# Patient Record
Sex: Male | Born: 1955 | Race: White | Hispanic: No | Marital: Married | Smoking: Former smoker
Health system: Southern US, Community
[De-identification: ages and names within clinical notes are randomized; demographics above are authoritative.]

## PROBLEM LIST (undated history)

## (undated) DIAGNOSIS — G4730 Sleep apnea, unspecified: Secondary | ICD-10-CM

## (undated) DIAGNOSIS — H9313 Tinnitus, bilateral: Secondary | ICD-10-CM

## (undated) DIAGNOSIS — M7500 Adhesive capsulitis of unspecified shoulder: Secondary | ICD-10-CM

## (undated) DIAGNOSIS — K649 Unspecified hemorrhoids: Secondary | ICD-10-CM

## (undated) DIAGNOSIS — M722 Plantar fascial fibromatosis: Secondary | ICD-10-CM

## (undated) DIAGNOSIS — L989 Disorder of the skin and subcutaneous tissue, unspecified: Secondary | ICD-10-CM

## (undated) DIAGNOSIS — I739 Peripheral vascular disease, unspecified: Secondary | ICD-10-CM

## (undated) DIAGNOSIS — E785 Hyperlipidemia, unspecified: Secondary | ICD-10-CM

## (undated) DIAGNOSIS — Z972 Presence of dental prosthetic device (complete) (partial): Secondary | ICD-10-CM

## (undated) DIAGNOSIS — M1990 Unspecified osteoarthritis, unspecified site: Secondary | ICD-10-CM

## (undated) DIAGNOSIS — C801 Malignant (primary) neoplasm, unspecified: Secondary | ICD-10-CM

## (undated) DIAGNOSIS — I10 Essential (primary) hypertension: Secondary | ICD-10-CM

## (undated) DIAGNOSIS — H9190 Unspecified hearing loss, unspecified ear: Secondary | ICD-10-CM

## (undated) DIAGNOSIS — T148XXA Other injury of unspecified body region, initial encounter: Secondary | ICD-10-CM

## (undated) DIAGNOSIS — R7989 Other specified abnormal findings of blood chemistry: Secondary | ICD-10-CM

## (undated) DIAGNOSIS — H43812 Vitreous degeneration, left eye: Secondary | ICD-10-CM

## (undated) HISTORY — DX: Other injury of unspecified body region, initial encounter: T14.8XXA

## (undated) HISTORY — DX: Other specified abnormal findings of blood chemistry: R79.89

## (undated) HISTORY — PX: TONSILLECTOMY: SUR1361

## (undated) HISTORY — DX: Vitreous degeneration, left eye: H43.812

## (undated) HISTORY — DX: Disorder of the skin and subcutaneous tissue, unspecified: L98.9

## (undated) HISTORY — DX: Sleep apnea, unspecified: G47.30

## (undated) HISTORY — PX: SKIN BIOPSY: SHX1

## (undated) HISTORY — PX: PROSTATE SURGERY: SHX751

## (undated) HISTORY — PX: APPENDECTOMY: SHX54

---

## 1996-01-31 HISTORY — PX: HAMMER TOE SURGERY: SHX385

## 2005-09-26 ENCOUNTER — Ambulatory Visit: Payer: Self-pay | Admitting: Gastroenterology

## 2007-10-25 ENCOUNTER — Ambulatory Visit: Payer: Self-pay | Admitting: Urology

## 2008-01-30 ENCOUNTER — Ambulatory Visit: Payer: Self-pay | Admitting: Urology

## 2008-12-21 ENCOUNTER — Ambulatory Visit: Payer: Self-pay | Admitting: Gastroenterology

## 2010-01-30 HISTORY — PX: OTHER SURGICAL HISTORY: SHX169

## 2011-07-28 ENCOUNTER — Encounter: Payer: Self-pay | Admitting: Urology

## 2011-07-28 LAB — HEMATOCRIT: HCT: 55 % — ABNORMAL HIGH (ref 40.0–52.0)

## 2011-07-31 ENCOUNTER — Encounter: Payer: Self-pay | Admitting: Urology

## 2011-08-02 LAB — HEMOGLOBIN: HGB: 16.6 g/dL (ref 13.0–18.0)

## 2011-08-02 LAB — HEMATOCRIT: HCT: 49.5 % (ref 40.0–52.0)

## 2011-08-31 ENCOUNTER — Encounter: Payer: Self-pay | Admitting: Urology

## 2011-11-03 ENCOUNTER — Ambulatory Visit: Payer: Self-pay

## 2012-01-31 HISTORY — PX: LASER OF PROSTATE W/ GREEN LIGHT PVP: SHX1953

## 2012-12-31 ENCOUNTER — Ambulatory Visit: Payer: Self-pay | Admitting: Urology

## 2013-01-06 ENCOUNTER — Ambulatory Visit: Payer: Self-pay | Admitting: Urology

## 2014-03-02 HISTORY — PX: LYMPH NODE BIOPSY: SHX201

## 2014-03-31 DIAGNOSIS — C801 Malignant (primary) neoplasm, unspecified: Secondary | ICD-10-CM

## 2014-03-31 HISTORY — DX: Malignant (primary) neoplasm, unspecified: C80.1

## 2014-04-01 ENCOUNTER — Ambulatory Visit: Payer: Self-pay | Admitting: Otolaryngology

## 2014-04-07 ENCOUNTER — Ambulatory Visit
Admit: 2014-04-07 | Disposition: A | Discharge status: Home / Self Care | Payer: Self-pay | Attending: Oncology | Admitting: Oncology

## 2014-04-13 ENCOUNTER — Ambulatory Visit: Payer: Self-pay | Admitting: Otolaryngology

## 2014-04-14 ENCOUNTER — Ambulatory Visit: Payer: Self-pay | Admitting: Oncology

## 2014-05-01 ENCOUNTER — Ambulatory Visit
Admit: 2014-05-01 | Disposition: A | Discharge status: Home / Self Care | Payer: Self-pay | Attending: Oncology | Admitting: Oncology

## 2014-05-15 ENCOUNTER — Other Ambulatory Visit: Payer: Self-pay | Admitting: Oncology

## 2014-05-15 DIAGNOSIS — C8298 Follicular lymphoma, unspecified, lymph nodes of multiple sites: Secondary | ICD-10-CM

## 2014-05-15 DIAGNOSIS — C8590 Non-Hodgkin lymphoma, unspecified, unspecified site: Secondary | ICD-10-CM

## 2014-05-22 NOTE — Op Note (Signed)
PATIENT NAME:  Jared Cowan, Jared Cowan MR#:  [id number] DATE OF BIRTH:  Oct 22, 1955  DATE OF PROCEDURE:  01/06/2013  PREOPERATIVE DIAGNOSIS: Benign prostatic hypertrophy with bladder outlet obstruction.   POSTOPERATIVE DIAGNOSIS: Benign prostatic hypertrophy with bladder outlet obstruction.   PROCEDURE: Photovaporization of the prostate with green light laser.   SURGEON: Maryan Puls, M.D.   ANESTHETIST: Dennard Nip, M.D.   ANESTHESIA: General.   INDICATIONS: See the dictated history and physical. After informed consent, the patient requests the above procedure.   OPERATIVE SUMMARY: After adequate general anesthesia had been obtained, the patient was placed into dorsal lithotomy position and the perineum was prepped and draped in the usual fashion. The laser scope was coupled with the camera and then visually advanced into the bladder. The bladder was thoroughly inspected. Both ureteral orifices were identified and had clear efflux. No bladder tumors were identified. The patient had trilobar prostatic hypertrophy. At this point, the green light laser power was set at 80 watts and bladder neck tissue was vaporized. Power was then increased up to 120 watts and obstructive tissue from the bladder neck to the verumontanum was vaporized. Finally, the power was increased to 180 watts and remaining obstructive tissue vaporized. Laser scope was then removed. Catheter was placed. The catheter had significant bloody drainage. Therefore, the catheter was removed and resectoscope was placed. Using the roller ball electrode, the prostatic fossa was coagulated. The resectoscope was then removed and a 20-French Foley catheter placed. The catheter was irrigated until clear. B and O suppository was placed. The procedure was then terminated and the patient was transferred to the recovery room in stable condition.   ____________________________ Otelia Limes. Yves Dill, MD [email or web address] D: 01/06/2013 09:30:03 ET T: 01/06/2013  09:50:31 ET JOB#: [id number]  cc: Otelia Limes. Yves Dill, MD, <Dictator> Royston Cowper MD ELECTRONICALLY SIGNED 01/07/2013 8:18

## 2014-05-22 NOTE — H&P (Signed)
PATIENT NAME:  Jared Cowan, Jared Cowan MR#:  [id number] DATE OF BIRTH:  06-18-1955  DATE OF ADMISSION:  01/06/2013  CHIEF COMPLAINT: Difficulty voiding.   HISTORY OF PRESENT ILLNESS: Jared Cowan is a 59 year old white male with a long history of BPH and lower urinary tract symptoms. 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 He has not had significant improvement of his urinary symptoms. At this time AUA score was 21. He is on Jalyn and has been on that for more than a year without significant symptomatic improvement. He has also tried daily Cialis without symptomatic improvement. He underwent ultrasound of the prostate on 09/08 indicating a 58.9 gram prostate with a median lobe present. He comes in now for photo vaporization of the prostate with the green light laser.   ALLERGIES:  No drug allergies.   CURRENT MEDICATIONS: Include Cialis, Jalyn, benazepril, Singulair, fish oil, and Tylenol.   PAST SURGICAL HISTORY:  No previous surgical procedures.   SOCIAL HISTORY: The patient denied tobacco or alcohol use.   FAMILY HISTORY: Negative for prostate cancer.   PAST AND CURRENT MEDICAL CONDITIONS:  1.  Sleep apnea.  2.  Allergic rhinitis.  3.  Hyperlipidemia.  4.  Hypertension.   REVIEW OF SYSTEMS: The patient denied chest pain, shortness of breath, stroke, heart disease or coronary artery disease.   PHYSICAL EXAMINATION: GENERAL: A well-nourished white male in no distress.  HEENT: Sclerae are clear. Pupils were equally round, reactive to light and accommodation.  NECK: Supple. No palpable cervical adenopathy.  LUNGS: Clear to auscultation.  CARDIOVASCULAR: Regular rhythm and rate without audible murmurs.  ABDOMEN: Soft, nontender abdomen.  GENITOURINARY: Circumcised. Testes smooth and nontender.  RECTAL: 50 grams smooth nontender prostate.  NEUROMUSCULAR: Alert and oriented x 3.   IMPRESSION: 1.  Benign prostatic hypertrophy with bladder outlet obstruction.  2.  Erectile dysfunction. 3.   Hypogonadism.    PLAN:  Photo vaporization prostate with the green light laser.     ____________________________ Otelia Limes. Yves Dill, MD [email or web address] D: 12/31/2012 16:12:00 ET T: 12/31/2012 16:42:45 ET JOB#: [id number]  cc: Otelia Limes. Yves Dill, MD, <Dictator> Royston Cowper MD ELECTRONICALLY SIGNED 12/31/2012 16:59

## 2014-05-25 LAB — SURGICAL PATHOLOGY

## 2014-05-31 NOTE — Op Note (Signed)
PATIENT NAME:  Jared Cowan, Jared Cowan MR#:  [id number]  DATE OF PROCEDURE:   04/13/2014  PREOPERATIVE DIAGNOSIS: Left deep neck node with suspicion of lymphoma.   POSTOPERATIVE DIAGNOSIS:  Left deep neck node with suspicion of lymphoma.  OPERATIVE PROCEDURE:  Excision left deep neck node.   SURGEON: Huey Romans, M.D.   ANESTHESIA: General.   COMPLICATIONS: None.   TOTAL ESTIMATED BLOOD LOSS:  25 mL.   DESCRIPTION OF PROCEDURE: The patient was given general anesthesia by laryngeal mask anesthesia. He was not paralyzed so that we could monitor his nerves in his neck. A shoulder roll was placed, the head was extended some, turned to the right.  You could feel a large left deep neck node that was posterior to the sternocleidomastoid and lie in the mid neck near the superior supraclavicular tissues. This was approximately 4 cm in diameter. The area was marked first and 2.5 mL of 1% Xylocaine with epinephrine 100,000 was used for infiltration of the skin. He was then prepped and draped in sterile fashion.   An incision was created through the skin and subcutaneous down to the deep tissues and fascia was incised and then large node was visible. There was a fairly significant vein that was crossing directly over the node. This was cut with Harmonic scalpel posteriorly, but anteriorly it was a little bit larger and a 2-0 silk tie was placed on the end of it. There is lots of fibrous tissue attached to the node all the way around.  It was followed deep.  It was attached to muscle bed inferiorly and the Harmonic scalpel was used to free this up. A nerve stimulator was used to look for the 11th cranial nerve, but this was not found in the wound. The dissection was carried around the large lymph node. There were multiple small little lymph nodes that were attached to it that were evident in the neck as well.  Many of these came out along with the large mass. Dissection was carried slowly around it freeing up the  fibrous tissue and small vessels that were attached to it and removing some of these lymph nodes. Finally got all the way around it to free it up and remove it from the wound.  It was sent fresh to pathology with suspicion of lymphoma. The wound was copiously irrigated.  We used bipolar cautery to help control any small bits of oozing. It was relatively dry and felt that a drain was not necessary. Four-0 Vicryl was used deep to help close some of the dead space and then 4-0 Vicryls were used in the muscle layer to help enclose the dermis. The skin edges were then held in apposition using a 6-0 nylon running locking suture. The wound was covered with bacitracin, Telfa and Tegaderm.  The patient tolerated the procedure well. He was awakened and taken to the recovery room in satisfactory condition. There were no operative complications. Total estimated blood loss was 25 mL.    ____________________________ Huey Romans, MD phj:sp D: 04/13/2014 09:54:00 ET T: 04/13/2014 10:25:25 ET JOB#: [id number]  cc: Huey Romans, MD, <Dictator> Huey Romans MD ELECTRONICALLY SIGNED 04/20/2014 10:44

## 2014-07-13 ENCOUNTER — Other Ambulatory Visit: Payer: Self-pay | Admitting: Oncology

## 2014-07-13 DIAGNOSIS — C8298 Follicular lymphoma, unspecified, lymph nodes of multiple sites: Secondary | ICD-10-CM

## 2014-07-13 DIAGNOSIS — C8590 Non-Hodgkin lymphoma, unspecified, unspecified site: Secondary | ICD-10-CM

## 2014-07-14 ENCOUNTER — Ambulatory Visit
Admission: RE | Admit: 2014-07-14 | Discharge: 2014-07-14 | Disposition: A | Discharge status: Home / Self Care | Payer: 59 | Source: Ambulatory Visit | Attending: Oncology | Admitting: Oncology

## 2014-07-14 ENCOUNTER — Other Ambulatory Visit: Payer: Self-pay

## 2014-07-14 ENCOUNTER — Other Ambulatory Visit: Payer: Self-pay | Admitting: Oncology

## 2014-07-14 ENCOUNTER — Other Ambulatory Visit: Payer: Self-pay | Admitting: *Deleted

## 2014-07-14 ENCOUNTER — Inpatient Hospital Stay: Payer: 59 | Attending: Oncology

## 2014-07-14 DIAGNOSIS — C8290 Follicular lymphoma, unspecified, unspecified site: Secondary | ICD-10-CM | POA: Insufficient documentation

## 2014-07-14 DIAGNOSIS — Z79899 Other long term (current) drug therapy: Secondary | ICD-10-CM | POA: Diagnosis not present

## 2014-07-14 DIAGNOSIS — I10 Essential (primary) hypertension: Secondary | ICD-10-CM | POA: Diagnosis not present

## 2014-07-14 DIAGNOSIS — C8300 Small cell B-cell lymphoma, unspecified site: Secondary | ICD-10-CM | POA: Insufficient documentation

## 2014-07-14 DIAGNOSIS — C8580 Other specified types of non-Hodgkin lymphoma, unspecified site: Secondary | ICD-10-CM | POA: Insufficient documentation

## 2014-07-14 DIAGNOSIS — R079 Chest pain, unspecified: Secondary | ICD-10-CM | POA: Diagnosis not present

## 2014-07-14 HISTORY — DX: Essential (primary) hypertension: I10

## 2014-07-14 HISTORY — DX: Malignant (primary) neoplasm, unspecified: C80.1

## 2014-07-14 LAB — BASIC METABOLIC PANEL
ANION GAP: 3 — AB (ref 5–15)
BUN: 16 mg/dL (ref 6–20)
CO2: 32 mmol/L (ref 22–32)
Calcium: 9.3 mg/dL (ref 8.9–10.3)
Chloride: 103 mmol/L (ref 101–111)
Creatinine, Ser: 1.14 mg/dL (ref 0.61–1.24)
GFR calc Af Amer: >60 mL/min (ref >60)
GFR calc non Af Amer: >60 mL/min (ref >60)
Glucose, Bld: 110 mg/dL — ABNORMAL HIGH (ref 65–99)
POTASSIUM: 4.3 mmol/L (ref 3.5–5.1)
SODIUM: 138 mmol/L (ref 135–145)

## 2014-07-14 LAB — CBC WITH DIFFERENTIAL/PLATELET
BASOS ABS: 0 10*3/uL (ref 0–0.1)
BASOS PCT: 1 %
EOS PCT: 2 %
Eosinophils Absolute: 0.1 10*3/uL (ref 0–0.7)
HCT: 50.8 % (ref 40.0–52.0)
HEMOGLOBIN: 17.2 g/dL (ref 13.0–18.0)
Lymphocytes Relative: 19 %
Lymphs Abs: 1.1 10*3/uL (ref 1.0–3.6)
MCH: 30.9 pg (ref 26.0–34.0)
MCHC: 33.8 g/dL (ref 32.0–36.0)
MCV: 91.3 fL (ref 80.0–100.0)
Monocytes Absolute: 0.6 10*3/uL (ref 0.2–1.0)
Monocytes Relative: 11 %
NEUTROS PCT: 67 %
Neutro Abs: 3.9 10*3/uL (ref 1.4–6.5)
PLATELETS: 142 10*3/uL — AB (ref 150–440)
RBC: 5.57 MIL/uL (ref 4.40–5.90)
RDW: 13.5 % (ref 11.5–14.5)
WBC: 5.8 10*3/uL (ref 3.8–10.6)

## 2014-07-14 MED ORDER — IOHEXOL 350 MG/ML SOLN
100.0000 mL | Freq: Once | INTRAVENOUS | Status: AC | PRN
  Administered 2014-07-14: 100 mL via INTRAVENOUS

## 2014-07-15 LAB — KAPPA/LAMBDA LIGHT CHAINS
Kappa free light chain: 24.09 mg/L — ABNORMAL HIGH (ref 3.30–19.40)
Kappa, lambda light chain ratio: 1.96 — ABNORMAL HIGH (ref 0.26–1.65)
LAMDA FREE LIGHT CHAINS: 12.29 mg/L (ref 5.71–26.30)

## 2014-07-17 ENCOUNTER — Inpatient Hospital Stay: Payer: 59

## 2014-07-17 ENCOUNTER — Inpatient Hospital Stay (HOSPITAL_BASED_OUTPATIENT_CLINIC_OR_DEPARTMENT_OTHER): Payer: 59 | Admitting: Oncology

## 2014-07-17 VITALS — BP 143/76 | HR 57 | Temp 97.0°F | Resp 20 | Ht 69.0 in | Wt 228.8 lb

## 2014-07-17 DIAGNOSIS — C8300 Small cell B-cell lymphoma, unspecified site: Secondary | ICD-10-CM

## 2014-07-17 DIAGNOSIS — Z79899 Other long term (current) drug therapy: Secondary | ICD-10-CM | POA: Diagnosis not present

## 2014-07-24 ENCOUNTER — Ambulatory Visit (INDEPENDENT_AMBULATORY_CARE_PROVIDER_SITE_OTHER): Payer: 59 | Admitting: Family Medicine

## 2014-07-24 ENCOUNTER — Encounter: Payer: Self-pay | Admitting: Family Medicine

## 2014-07-24 VITALS — BP 143/79 | HR 66 | Temp 98.2°F | Ht 69.3 in | Wt 228.6 lb

## 2014-07-24 DIAGNOSIS — M7502 Adhesive capsulitis of left shoulder: Secondary | ICD-10-CM

## 2014-07-24 DIAGNOSIS — S76311A Strain of muscle, fascia and tendon of the posterior muscle group at thigh level, right thigh, initial encounter: Secondary | ICD-10-CM

## 2014-07-24 DIAGNOSIS — S76319A Strain of muscle, fascia and tendon of the posterior muscle group at thigh level, unspecified thigh, initial encounter: Secondary | ICD-10-CM | POA: Insufficient documentation

## 2014-07-24 DIAGNOSIS — M7500 Adhesive capsulitis of unspecified shoulder: Secondary | ICD-10-CM | POA: Insufficient documentation

## 2014-07-24 NOTE — Progress Notes (Signed)
BP 143/79 mmHg  Pulse 66  Temp(Src) 98.2 F (36.8 C)  Ht 5' 9.3" (1.76 m)  Wt 228 lb 9.6 oz (103.692 kg)  BMI 33.47 kg/m2  SpO2 96%   Subjective:    Patient ID: Jared Cowan, male    DOB: 10-11-1955, 59 y.o.   MRN: [id number]  HPI: Jared Cowan is a 59 y.o. male  Chief Complaint  Patient presents with  . Leg Pain    pulled hamstring  . Shoulder Pain    left shoulder, unable to lift arm up without pain   Has had 2 major concerns- pulled his R hamstring, foot slipped when he was running over [employer] day. Has been doing some rest, ice and elevation. Seems like it is getting a bit better. Has not been taking advil with it. Pain is gone, but mainly now mainly just concerned about the bruising and wants to get back on his bike. No n/t. No swelling.   L shoulder had a biopsy at the cancer center, has been having some decreased ROM of the L shoulder since then. Had not been doing any stretches. He would like to get into PT. His wife is currently doing PT with [facility] and he would like to go there too. No other concerns or complaints at this time.   Relevant past medical, surgical, family and social history reviewed and updated as indicated. Interim medical history since our last visit reviewed. Allergies and medications reviewed and updated.  Review of Systems  Constitutional: Negative.   Respiratory: Negative.   Cardiovascular: Negative.   Musculoskeletal: Positive for myalgias and arthralgias. Negative for back pain, gait problem, neck pain and neck stiffness.  Skin: Positive for color change. Negative for pallor, rash and wound.  Psychiatric/Behavioral: Negative.     Per HPI unless specifically indicated above     Objective:    BP 143/79 mmHg  Pulse 66  Temp(Src) 98.2 F (36.8 C)  Ht 5' 9.3" (1.76 m)  Wt 228 lb 9.6 oz (103.692 kg)  BMI 33.47 kg/m2  SpO2 96%  Wt Readings from Last 3 Encounters:  07/24/14 228 lb 9.6 oz (103.692 kg)  07/23/14 226 lb (102.513 kg)   07/17/14 228 lb 13.4 oz (103.8 kg)    Physical Exam  Constitutional: He is oriented to person, place, and time. He appears well-developed and well-nourished. No distress.  HENT:  Head: Normocephalic and atraumatic.  Right Ear: Hearing normal.  Left Ear: Hearing normal.  Nose: Nose normal.  Eyes: Conjunctivae and lids are normal. Right eye exhibits no discharge. Left eye exhibits no discharge. No scleral icterus.  Pulmonary/Chest: Effort normal. No respiratory distress.  Neurological: He is alert and oriented to person, place, and time.  Skin: Skin is intact. No rash noted.  Bruising on the medial R thigh and lateral R thigh, tender to touch  Psychiatric: He has a normal mood and affect. His speech is normal and behavior is normal. Judgment and thought content normal. Cognition and memory are normal.     Shoulder: left    Inspection:  no swelling, ecchymosis, erythema or step off deformity.    Tenderness to Palpation:    Acromion: no    AC joint:no    Clavicle: no    Bicipital groove: no    Scapular spine: no    Coracoid process: no    Humeral head: no    Supraspinatus tendon: no     Range of Motion:     Abduction:Decreased 120 degrees  Adduction: Decreased    Flexion: Decreased 110 degrees    Extension: Decreased    Internal rotation: Normal    External rotation: Decreased    Painful arc: yes     Muscle Strength: 5/5 bilaterally    Neuro: Sensation WNL. and Upper extremity reflexes WNL.      Assessment & Plan:   Problem List Items Addressed This Visit      Musculoskeletal and Integument   Hamstring strain - Primary    Advised patient to continue icing. Advised him that a 20-80mile bike ride might be too long, and to listen to his body and try cycling for just a few miles and stopping if it's hurting first. He will call if not getting better or getting worse. Continue to monitor.       Frozen shoulder    L shoulder seems to have frozen after his biopsy with lack  of movement. Will refer to PT. Recheck in 4-6 weeks unless getting worse in which case we will see him earlier.      Relevant Orders   Ambulatory referral to Physical Therapy       Follow up plan: Return 4-6 weeks, for Follow up on shoulder.

## 2014-07-24 NOTE — Assessment & Plan Note (Signed)
L shoulder seems to have frozen after his biopsy with lack of movement. Will refer to PT. Recheck in 4-6 weeks unless getting worse in which case we will see him earlier.

## 2014-07-24 NOTE — Assessment & Plan Note (Signed)
Advised patient to continue icing. Advised him that a 20-78mile bike ride might be too long, and to listen to his body and try cycling for just a few miles and stopping if it's hurting first. He will call if not getting better or getting worse. Continue to monitor.

## 2014-07-24 NOTE — Patient Instructions (Signed)
Hamstring Strain with Rehab The hamstring muscle and tendons are vulnerable to muscle or tendon tear (strain). Hamstring tears cause pain and inflammation in the backside of the thigh, where the hamstring muscles are located. The hamstring is comprised of three muscles that are responsible for straightening the hip, bending the knee, and stabilizing the knee. These muscles are important for walking, running, and jumping. Hamstring strain is the most common injury of the thigh. Hamstring strains are classified as grade 1 or 2 strains. Grade 1 strains cause pain, but the tendon is not lengthened. Grade 2 strains include a lengthened ligament due to the ligament being stretched or partially ruptured. With grade 2 strains there is still function, although the function may be decreased.  SYMPTOMS   Pain, tenderness, swelling, warmth, or redness over the hamstring muscles, at the back of the thigh.  Pain that gets worse during and after intense activity.  A "pop" heard in the area, at the time of injury.  Muscle spasm in the hamstring muscles.  Pain or weakness with running, jumping, or bending the knee against resistance.  Crackling sound (crepitation) when the tendon is moved or touched.  Bruising (contusion) in the thigh within 48 hours of injury.  Loss of fullness of the muscle, or area of muscle bulging in the case of a complete rupture. CAUSES  A muscle strain occurs when a force is placed on the muscle or tendon that is greater than it can withstand. Common causes of injury include:  Strain from overuse or sudden increase in the frequency, duration, or intensity of activity.  Single violent blow or force to the back of the knee or the hamstring area of the thigh. RISK INCREASES WITH:  Sports that require quick starts (sprinting, racquetball, tennis).  Sports that require jumping (basketball, volleyball).  Kicking sports and water skiing.  Contact sports (soccer, football).  Poor  strength and flexibility.  Failure to warm up properly before activity.  Previous thigh, knee, or pelvis injury.  Poor exercise technique.  Poor posture. PREVENTION  Maintain physical fitness:  Strength, flexibility, and endurance.  Cardiovascular fitness.  Learn and use proper exercise technique and posture.  Wear proper fitted and padded protective equipment. PROGNOSIS  If treated properly, hamstring strains are usually curable in 2 to 6 weeks. RELATED COMPLICATIONS   Longer healing time, if not properly treated or if not given adequate time to heal.  Chronically inflamed tendon, causing persistent pain with activity that may progress to constant pain.  Recurring symptoms, if activity is resumed too soon.  Vulnerable to repeated injury (in up to 25% of cases). TREATMENT  Treatment first involves the use of ice and medication to help reduce pain and inflammation. It is also important to complete strengthening and stretching exercises, as well as modifying any activities that aggravate the symptoms. These exercises may be completed at home or with a therapist. Your caregiver may recommend the use of crutches to help reduce pain and discomfort, especially is the strain is severe enough to cause limping. If the tendon has pulled away from the bone, then surgery is necessary to reattach it. MEDICATION   If pain medicine is needed, nonsteroidal anti-inflammatory medicines (aspirin and ibuprofen), or other minor pain relievers (acetaminophen), are often advised.  Do not take pain medicine for 7 days before surgery.  Prescription pain relievers may be given if your caregiver thinks they are needed. Use only as directed and only as much as you need.  Corticosteroid injections may be  recommended. However, these injections should only be used for serious cases, as they can only be given a certain number of times.  Ointments applied to the skin may be beneficial. HEAT AND  COLD  Cold treatment (icing) relieves pain and reduces inflammation. Cold treatment should be applied for 10 to 15 minutes every 2 to 3 hours, and immediately after activity that aggravates your symptoms. Use ice packs or an ice massage.  Heat treatment may be used before performing the stretching and strengthening activities prescribed by your caregiver, physical therapist, or athletic trainer. Use a heat pack or a warm water soak. SEEK MEDICAL CARE IF:   Symptoms get worse or do not improve in 2 weeks, despite treatment.  New, unexplained symptoms develop. (Drugs used in treatment may produce side effects.) EXERCISES RANGE OF MOTION (ROM) AND STRETCHING EXERCISES - Hamstring Strain These exercises may help you when beginning to rehabilitate your injury. Your symptoms may go away with or without further involvement from your physician, physical therapist or athletic trainer. While completing these exercises, remember:   Restoring tissue flexibility helps normal motion to return to the joints. This allows healthier, less painful movement and activity.  An effective stretch should be held for at least 30 seconds.  A stretch should never be painful. You should only feel a gentle lengthening or release in the stretched tissue. STRETCH - Hamstrings, Standing  Stand or sit, and extend your right / left leg, placing your foot on a chair or foot stool.  Keep a slight arch in your low back and your hips straight forward.  Lead with your chest, and lean forward at the waist until you feel a gentle stretch in the back of your right / left knee or thigh. (When done correctly, this exercise requires leaning only a small distance.)  Hold this position for __________ seconds. Repeat __________ times. Complete this stretch __________ times per day. STRETCH - Hamstrings, Supine   Lie on your back. Loop a belt or towel over the ball of your right / left foot.  Straighten your right / left knee and  slowly pull on the belt to raise your leg. Do not allow the right / left knee to bend. Keep your opposite leg flat on the floor.  Raise the leg until you feel a gentle stretch behind your right / left knee or thigh. Hold this position for __________ seconds. Repeat __________ times. Complete this stretch __________ times per day.  STRETCH - Hamstrings, Doorway  Lie on your back with your right / left leg extended and resting on the wall, and the opposite leg flat on the ground through the door. Initially, position your bottom farther away from the wall.  Keep your right / left knee straight. If you feel a stretch behind your knee or thigh, hold this position for __________ seconds.  If you do not feel a stretch, scoot your bottom closer to the door and hold __________ seconds. Repeat __________ times. Complete this stretch __________ times per day.  STRETCH - Hamstrings/Adductors, V-Sit   Sit on the floor with your legs extended in a large "V," keeping your knees straight.  With your head and chest upright, bend at your waist reaching for your left foot to stretch your right thigh muscles.  You should feel a stretch in your right inner thigh. Hold for __________ seconds.  Return to the upright position to relax your leg muscles.  Continuing to keep your chest upright, bend straight forward at your  waist to stretch your hamstrings.  You should feel a stretch behind both of your thighs and knees. Hold for __________ seconds.  Return to the upright position to relax your leg muscles.  With your head and chest upright, bend at your waist reaching for your right foot to stretch your left thigh muscles.  You should feel a stretch in your left inner thigh. Hold for __________ seconds.  Return to the upright position to relax your leg muscles. Repeat __________ times. Complete this exercise __________ times per day.  STRENGTHENING EXERCISES - Hamstring Strain These exercises may help you  when beginning to rehabilitate your injury. They may resolve your symptoms with or without further involvement from your physician, physical therapist or athletic trainer. While completing these exercises, remember:   Muscles can gain both the endurance and the strength needed for everyday activities through controlled exercises.  Complete these exercises as instructed by your physician, physical therapist or athletic trainer. Increase the resistance and repetitions only as guided.  You may experience muscle soreness or fatigue, but the pain or discomfort you are trying to eliminate should never get worse during these exercises. If this pain does get worse, stop and make certain you are following the directions exactly. If the pain is still present after adjustments, discontinue the exercise until you can discuss the trouble with your clinician. STRENGTH - Hip Extensors, Straight Leg Raises   Lie on your stomach on a firm surface.  Tense the muscles in your buttocks to lift your right / left leg about 4 inches. If you cannot lift your leg this high without arching your back, place a pillow under your hips.  Keep your knee straight. Hold for __________ seconds.  Slowly lower your leg to the starting position and allow it to relax completely before starting the next repetition. Repeat __________ times. Complete this exercise __________ times per day.  STRENGTH - Hamstring, Isometrics   Lie on your back on a firm surface.  Bend your right / left knee approximately __________ degrees.  Dig your heel into the surface, as if you are trying to pull it toward your buttocks. Tighten the muscles in the back of your thighs to "dig" as hard as you can, without increasing any pain.  Hold this position for __________ seconds.  Release the tension gradually and allow your muscles to completely relax for __________ seconds between each exercise. Repeat __________ times. Complete this exercise __________  times per day.  STRENGTH - Hamstring, Curls   Lay on your stomach with your legs extended. (If you lay on a bed, your feet may hang over the edge.)  Tighten the muscles in the back of your thigh to bend your right / left knee up to 90 degrees. Keep your hips flat on the bed or floor.  Hold this position for __________ seconds.  Slowly lower your leg back to the starting position. Repeat __________ times. Complete this exercise __________ times per day.  OPTIONAL ANKLE WEIGHTS: Begin with ____________________, but DO NOT exceed ____________________. Increase in 1 pound/0.5 kilogram increments. Document Released: 01/16/2005 Document Revised: 04/10/2011 Document Reviewed: 04/30/2008 [facility] Patient Information 2015 [place], [place]. This information is not intended to replace advice given to you by your health care provider. Make sure you discuss any questions you have with your health care provider.

## 2014-07-27 ENCOUNTER — Other Ambulatory Visit: Payer: 59

## 2014-08-03 NOTE — Progress Notes (Signed)
[place]  [phone] [phone]  ID: Jared Cowan OB: [birth year]  MR#: [id number]  EHM#:[id number]  Patient Care Team: Kathrine Haddock, NP as PCP - General (Nurse Practitioner)  CHIEF COMPLAINT:  Chief Complaint  Patient presents with  . Follow-up    Cancer    INTERVAL HISTORY: Patient returns to clinic today for further evaluation, laboratory work, and discussion of his imaging results. He continues to feel well and remains asymptomatic. He denies any fevers, chills, or night sweats. He has no neurologic complaints. He has a good appetite and denies weight loss. He has no dysphasia. He denies any chest pain or shortness of breath. He denies any nausea, vomiting, constipation, or diarrhea. He has no urinary complaints. Patient offers no specific complaints today.  REVIEW OF SYSTEMS:   Review of Systems  Constitutional: Negative for fever and weight loss.  Musculoskeletal: Negative.   Endo/Heme/Allergies: Does not bruise/bleed easily.    As per HPI. Otherwise, a complete review of systems is negatve.  PAST MEDICAL HISTORY: Past Medical History  Diagnosis Date  . Hypertension   . Low testosterone   . 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 Laser of prostate w/ green light pvp  2014  . Urethra and bladder unblocked  2012  . Hammer toe surgery  1998  . Appendectomy    . Prostate surgery    . Lymph node biopsy Left 03/2014    cancerous    FAMILY HISTORY Family History  Problem Relation Age of Onset  . Dementia Mother 41  . Hypertension Mother   . Hypertension Father   . Heart attack Father   . Heart disease Father   . Cancer Sister     breast  . Cancer Sister     breast  . Early death Paternal Grandfather   . Heart disease Paternal Grandfather        ADVANCED DIRECTIVES:    HEALTH MAINTENANCE: History  Substance Use Topics  .  Smoking status: Former Smoker    Quit date: 01/31/1996  . Smokeless tobacco: Never Used  . Alcohol Use: Yes     Comment: on occasion     Colonoscopy:  PAP:  Bone density:  Lipid panel:  No Active Allergies  Current Outpatient Prescriptions  Medication Sig Dispense Refill  . aspirin 81 MG chewable tablet Chew 81 mg by mouth daily.    Marland Kitchen atorvastatin (LIPITOR) 20 MG tablet     . meloxicam (MOBIC) 15 MG tablet     . Omega-3 Fatty Acids (FISH OIL) 1200 MG CAPS Take 1,200 mg by mouth 1 day or 1 dose.    . Testosterone (TESTOPEL IL) by Implant route.    Marland Kitchen acetaminophen (TYLENOL) 650 MG suppository Place 650 mg rectally every other day.    . benazepril (LOTENSIN) 20 MG tablet Take 20 mg by mouth daily.    . Glucosamine-Chondroit-Vit C-Mn (GLUCOSAMINE 1500 COMPLEX PO) Take 1,500 mg by mouth daily.    . montelukast (SINGULAIR) 10 MG tablet Take 10 mg by mouth daily.     No current facility-administered medications for this visit.    OBJECTIVE: Filed Vitals:   07/17/14 0844  BP: 143/76  Pulse: 57  Temp: 97 F (36.1 C)  Resp: 20     Body mass index is 33.78 kg/(m^2).    ECOG FS:0 - Asymptomatic  General: Well-developed, well-nourished, no acute distress. Eyes:  anicteric sclera. HEENT: Easily palpable lymph node on left neck.  Lungs: Clear to auscultation bilaterally. Heart: Regular rate and rhythm. No rubs, murmurs, or gallops. Abdomen: Soft, nontender, nondistended. No organomegaly noted, normoactive bowel sounds. Musculoskeletal: No edema, cyanosis, or clubbing. Neuro: Alert, answering all questions appropriately. Cranial nerves grossly intact. Skin: No rashes or petechiae noted. Psych: Normal affect.   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 Left upper chest pain.  EXAM: CT NECK, CHEST, ABDOMEN, AND PELVIS WITH CONTRAST  TECHNIQUE: Multidetector CT imaging of the neck, chest, abdomen and pelvis was performed following the standard protocol during bolus administration of intravenous contrast.  CONTRAST:  14mL OMNIPAQUE IOHEXOL 350 MG/ML SOLN  COMPARISON:  04/14/2014; 04/01/2014  FINDINGS: CT NECK FINDINGS  Scattered bilateral station 2, 3, 4, and 5 lymph nodes are present. An index right station 2A lymph node on image 43 series 2 measures 1.2 cm in short axis, formerly 1.4 cm. A right supraclavicular node measuring 1.5 cm on image 59 of series 2 previously measured the same. A left station 3 lymph node on image 65 of series 2 measures 1.2 cm in short axis, previously the same.  There are scattered small submandibular and submental lymph nodes. Tonsils appear normal and symmetric.  Chronic mild right maxillary sinusitis. The maxilla is newly edentulous.  Cervical spondylosis and degenerative disc disease observed with loss of disc height at C6-7 and C7-T1. Uncinate spurring causes osseous foraminal stenosis bilaterally at C6-7.  No additional significant findings.  CT CHEST FINDINGS  Mediastinum/Nodes: Bilateral axillary, mediastinal, paraesophageal, and periaortic adenopathy in the thorax a along with supraclavicular adenopathy rib discussed in the CT neck report above.  Index paraesophageal node 1.8 cm in short axis on image 28 series 3, formerly 1.6 cm. Index right axillary lymph node 2.0 cm in short axis on image 22 series 3, formerly 1.4 cm. Index left axillary lymph node 1.9 cm in short axis on image 9 series 3,  formerly 1.4 cm. There are newly enlarged axillary lymph nodes bilaterally.  Aortic arch atherosclerotic vascular disease noted.  Lungs/Pleura: Mild biapical pleural parenchymal scarring.  The lungs appear clear.  Musculoskeletal: Mild thoracic spondylosis.  CT ABDOMEN PELVIS FINDINGS  Hepatobiliary: Unremarkable  Pancreas: Unremarkable  Spleen: Unremarkable  Adrenals/Urinary Tract: Unremarkable  Stomach/Bowel: Unremarkable  Vascular/Lymphatic: Aortoiliac atherosclerotic vascular disease.  Again noted is considerable high mesenteric, retroperitoneal, and pelvic adenopathy. An index left anterior mesenteric lymph node measures 1.7 cm in short axis on image 75 series 3 (formerly 1.6 cm). A periaortic node just below the level of the left renal vein also on image 75 series 3 measures 2 cm in short axis, formerly 1.5 cm. A right common iliac node measures 1.7 cm in short axis on image 100 series 3, formerly 1.4 cm. A right external iliac node measures  1.3 cm in short axis on image 111 series 3, formerly 1.1 cm.  Reproductive: Mildly indistinct margins of the seminal vesicles and prostate gland. Suspected mild prostatic enlargement. Overall stable.  Other: No supplemental non-categorized findings.  Musculoskeletal: Facet arthropathy at L5-S1.  IMPRESSION: 1. Increased adenopathy in the chest and abdomen. Stable adenopathy in the neck.   Electronically Signed   By: Van Clines M.D.   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 Left upper chest pain.  EXAM: CT NECK, CHEST, ABDOMEN, AND PELVIS WITH CONTRAST  TECHNIQUE: Multidetector CT imaging of the neck, chest, abdomen and pelvis was performed following the standard protocol during bolus administration of intravenous contrast.  CONTRAST:  168mL OMNIPAQUE IOHEXOL 350 MG/ML SOLN  COMPARISON:  04/14/2014; 04/01/2014  FINDINGS: CT NECK FINDINGS  Scattered bilateral station 2,  3, 4, and 5 lymph nodes are present. An index right station 2A lymph node on image 43 series 2 measures 1.2 cm in short axis, formerly 1.4 cm. A right supraclavicular node measuring 1.5 cm on image 59 of series 2 previously measured the same. A left station 3 lymph node on image 65 of series 2 measures 1.2 cm in short axis, previously the same.  There are scattered small submandibular and submental lymph nodes. Tonsils appear normal and symmetric.  Chronic mild right maxillary sinusitis. The maxilla is newly edentulous.  Cervical spondylosis and degenerative disc disease observed with loss of disc height at C6-7 and C7-T1. Uncinate spurring causes osseous foraminal stenosis bilaterally at C6-7.  No additional significant findings.  CT CHEST FINDINGS  Mediastinum/Nodes: Bilateral axillary, mediastinal, paraesophageal, and periaortic adenopathy in the thorax a along with supraclavicular adenopathy rib discussed in the CT neck report above.  Index paraesophageal node 1.8 cm in short axis on image 28 series 3, formerly 1.6 cm. Index right axillary lymph node 2.0 cm in short axis on image 22 series 3, formerly 1.4 cm. Index left axillary lymph node 1.9 cm in short axis on image 9 series 3, formerly 1.4 cm. There are newly enlarged axillary lymph nodes bilaterally.  Aortic arch atherosclerotic vascular disease noted.  Lungs/Pleura: Mild biapical pleural parenchymal scarring.  The lungs appear clear.  Musculoskeletal: Mild thoracic spondylosis.  CT ABDOMEN PELVIS FINDINGS  Hepatobiliary: Unremarkable  Pancreas: Unremarkable  Spleen: Unremarkable  Adrenals/Urinary Tract: Unremarkable  Stomach/Bowel: Unremarkable  Vascular/Lymphatic: Aortoiliac atherosclerotic vascular disease.  Again noted is considerable high mesenteric, retroperitoneal, and pelvic adenopathy. An index left anterior mesenteric lymph node measures 1.7 cm in short axis on image 75 series 3 (formerly 1.6 cm). A periaortic node just below the level of the left  renal vein also on image 75 series 3 measures 2 cm in short axis, formerly 1.5 cm. A right common iliac node measures 1.7 cm in short axis on image 100 series 3, formerly 1.4 cm. A right external iliac node measures 1.3 cm in short axis on image 111 series 3, formerly 1.1 cm.  Reproductive: Mildly indistinct margins of the seminal vesicles and prostate gland. Suspected mild prostatic enlargement. Overall stable.  Other: No supplemental non-categorized findings.  Musculoskeletal: Facet arthropathy at L5-S1.  IMPRESSION: 1. Increased adenopathy in the chest and abdomen. Stable adenopathy in the neck.   Electronically Signed   By: Van Clines M.D.   On: 07/14/2014 13:25   Ct Abdomen Pelvis W Contrast  07/14/2014   CLINICAL DATA:  Small lymphocytic follicular lymphoma.  Prior left neck biopsy. Left upper chest pain.  EXAM: CT NECK, CHEST, ABDOMEN, AND PELVIS WITH CONTRAST  TECHNIQUE: Multidetector CT imaging of the neck, chest, abdomen and pelvis was performed following the standard protocol during bolus administration of intravenous contrast.  CONTRAST:  192mL OMNIPAQUE IOHEXOL 350 MG/ML SOLN  COMPARISON:  04/14/2014; 04/01/2014  FINDINGS: CT NECK FINDINGS  Scattered bilateral station 2, 3, 4, and 5 lymph nodes are present. An index right station 2A lymph node on image 43 series 2 measures 1.2 cm in short axis, formerly 1.4 cm. A right supraclavicular node measuring 1.5 cm on image 59 of series 2 previously measured the same. A left station 3 lymph node on image 65 of series 2 measures 1.2 cm in short axis, previously the same.  There are scattered small submandibular and submental lymph nodes. Tonsils appear normal and symmetric.  Chronic mild right maxillary sinusitis. The maxilla is newly edentulous.  Cervical spondylosis and degenerative disc disease observed with loss of disc height at C6-7 and C7-T1. Uncinate spurring causes osseous foraminal stenosis bilaterally at C6-7.  No additional significant  findings.  CT CHEST FINDINGS  Mediastinum/Nodes: Bilateral axillary, mediastinal, paraesophageal, and periaortic adenopathy in the thorax a along with supraclavicular adenopathy rib discussed in the CT neck report above.  Index paraesophageal node 1.8 cm in short axis on image 28 series 3, formerly 1.6 cm. Index right axillary lymph node 2.0 cm in short axis on image 22 series 3, formerly 1.4 cm. Index left axillary lymph node 1.9 cm in short axis on image 9 series 3, formerly 1.4 cm. There are newly enlarged axillary lymph nodes bilaterally.  Aortic arch atherosclerotic vascular disease noted.  Lungs/Pleura: Mild biapical pleural parenchymal scarring.  The lungs appear clear.  Musculoskeletal: Mild thoracic spondylosis.  CT ABDOMEN PELVIS FINDINGS  Hepatobiliary: Unremarkable  Pancreas: Unremarkable  Spleen: Unremarkable  Adrenals/Urinary Tract: Unremarkable  Stomach/Bowel: Unremarkable  Vascular/Lymphatic: Aortoiliac atherosclerotic vascular disease.  Again noted is considerable high mesenteric, retroperitoneal, and pelvic adenopathy. An index left anterior mesenteric lymph node measures 1.7 cm in short axis on image 75 series 3 (formerly 1.6 cm). A periaortic node just below the level of the left renal vein also on image 75 series 3 measures 2 cm in short axis, formerly 1.5 cm. A right common iliac node measures 1.7 cm in short axis on image 100 series 3, formerly 1.4 cm. A right external iliac node measures 1.3 cm in short axis on image 111 series 3, formerly 1.1 cm.  Reproductive: Mildly indistinct margins of the seminal vesicles and prostate gland. Suspected mild prostatic enlargement. Overall stable.  Other: No supplemental non-categorized findings.  Musculoskeletal: Facet arthropathy at L5-S1.  IMPRESSION: 1. Increased adenopathy in the chest and abdomen. Stable adenopathy in the neck.   Electronically Signed   By: Van Clines M.D.   On: 07/14/2014 13:25    ASSESSMENT: Small lymphocytic  lymphoma.  PLAN:    1. SLL: CT scan results as above reviewed independently with mild progression of disease. It is slightly unusual patient does not have a lymphocytosis concurrent with his lymphadenopathy. He has no other cytopenias or B symptoms. This is typically a very indolent process and treatment is not required immediately. If patient requires treatment in the future, would consider single agent Rituxan. Return to clinic in 3 months with repeat imaging and further evaluation.   Patient expressed understanding and was in agreement with this plan. He also understands that He can call clinic at any time with  any questions, concerns, or complaints.   No matching staging information was found for the patient.  Lloyd Huger, MD   08/03/2014 1:44 PM

## 2014-08-07 ENCOUNTER — Encounter: Payer: Self-pay | Admitting: Family Medicine

## 2014-09-09 ENCOUNTER — Telehealth: Payer: Self-pay

## 2014-09-09 NOTE — Telephone Encounter (Signed)
Patient has trouble moving shoulder since the biopsy and receiving physical therapy.  Juliann Pulse, physical therapist, would like to talk with Dr. Grayland Ormond.

## 2014-09-11 ENCOUNTER — Telehealth: Payer: Self-pay | Admitting: Family Medicine

## 2014-09-11 DIAGNOSIS — C8590 Non-Hodgkin lymphoma, unspecified, unspecified site: Secondary | ICD-10-CM

## 2014-09-11 DIAGNOSIS — Z13220 Encounter for screening for lipoid disorders: Secondary | ICD-10-CM

## 2014-09-11 NOTE — Telephone Encounter (Signed)
Pt called would like a lab visit to have cbc and cholesterol labs done. Pt wants lab request sent to [facility] in [facility] phone # is [phone]. Pt also wants [facility] to review your CT scan for everything other than cancer. Please call pt if more information needed.

## 2014-09-14 NOTE — Telephone Encounter (Signed)
Patient is scheduled for 4:00pm on Monday 09/21/14. He would like a order for blood work so that he can have this drawn at a lab close to his work and have the results for his appointment on Monday.

## 2014-09-14 NOTE — Addendum Note (Signed)
Addended by: Valerie Roys on: 09/14/2014 01:15 PM   Modules accepted: Orders

## 2014-09-14 NOTE — Telephone Encounter (Signed)
Order in there for him to get drawn. Not sure how we do that.

## 2014-09-14 NOTE — Telephone Encounter (Signed)
Will need appointment to go over results.

## 2014-09-14 NOTE — Telephone Encounter (Signed)
Forward to provider

## 2014-09-15 NOTE — Telephone Encounter (Signed)
Called left message for patient to return my call, the lab orders have been placed unsure of how the lab will get the order. Waiting for patients response.

## 2014-09-16 ENCOUNTER — Other Ambulatory Visit: Payer: Self-pay

## 2014-09-16 DIAGNOSIS — C8590 Non-Hodgkin lymphoma, unspecified, unspecified site: Secondary | ICD-10-CM

## 2014-09-16 DIAGNOSIS — Z13220 Encounter for screening for lipoid disorders: Secondary | ICD-10-CM

## 2014-09-21 ENCOUNTER — Ambulatory Visit: Payer: Self-pay | Admitting: Family Medicine

## 2014-09-21 ENCOUNTER — Telehealth: Payer: Self-pay | Admitting: *Deleted

## 2014-09-21 NOTE — Telephone Encounter (Signed)
This note was faxed to [facility]

## 2014-09-21 NOTE — Telephone Encounter (Signed)
They are treating him for his shoulder and asking if there is a contraindication to doing trigger point dry needling to relieve a knot in his shoulder for more ROM in his shoulder with his diagnosis of lymphoma

## 2014-09-21 NOTE — Telephone Encounter (Signed)
Per Dr Grayland Ormond there should be no problem with their request as patients plt count is not low. Electrical stimulation and heat will fine to perform as well. Hortencia Pilar informed of this and requests something in writing be faxed to her at [phone]

## 2014-09-24 ENCOUNTER — Ambulatory Visit (INDEPENDENT_AMBULATORY_CARE_PROVIDER_SITE_OTHER): Payer: 59 | Admitting: Family Medicine

## 2014-09-24 ENCOUNTER — Encounter: Payer: Self-pay | Admitting: Family Medicine

## 2014-09-24 VITALS — BP 130/68 | HR 62 | Temp 98.2°F | Wt 217.0 lb

## 2014-09-24 DIAGNOSIS — Z8249 Family history of ischemic heart disease and other diseases of the circulatory system: Secondary | ICD-10-CM | POA: Diagnosis not present

## 2014-09-24 DIAGNOSIS — I739 Peripheral vascular disease, unspecified: Secondary | ICD-10-CM

## 2014-09-24 DIAGNOSIS — I10 Essential (primary) hypertension: Secondary | ICD-10-CM

## 2014-09-24 DIAGNOSIS — E785 Hyperlipidemia, unspecified: Secondary | ICD-10-CM | POA: Diagnosis not present

## 2014-09-24 NOTE — Patient Instructions (Signed)

## 2014-09-24 NOTE — Progress Notes (Signed)
BP 130/68 mmHg  Pulse 62  Temp(Src) 98.2 F (36.8 C)  Wt 217 lb (98.431 kg)  SpO2 97%   Subjective:    Patient ID: Jared Cowan, male    DOB: 1955-07-09, 59 y.o.   MRN: [id number]  HPI: Jared Cowan is a 59 y.o. male who presents today to go over his CT scan done by oncology  Chief Complaint  Patient presents with  . Hypertension  . Hyperlipidemia   HYPERTENSION / HYPERLIPIDEMIA Satisfied with current treatment? no Duration of hypertension: chronic BP monitoring frequency: a few times a day BP range: 140s BP medication side effects: no Duration of hyperlipidemia: chronic Cholesterol medication side effects: yes- myalgias and memory issues Cholesterol supplements: fish oil Past cholesterol medications: atorvastain (lipitor) Medication compliance: excellent compliance Aspirin: yes Recent stressors: yes Recurrent headaches: no Visual changes: no Palpitations: no Dyspnea: no Chest pain: no Lower extremity edema: no Dizzy/lightheaded: no  Relevant past medical, surgical, family and social history reviewed and updated as indicated. Interim medical history since our last visit reviewed. Allergies and medications reviewed and updated.  Review of Systems  Constitutional: Negative.   Respiratory: Negative.   Cardiovascular: Negative.   Musculoskeletal: Positive for myalgias and arthralgias. Negative for back pain, joint swelling, gait problem, neck pain and neck stiffness.  Psychiatric/Behavioral: Negative.     Per HPI unless specifically indicated above     Objective:    BP 130/68 mmHg  Pulse 62  Temp(Src) 98.2 F (36.8 C)  Wt 217 lb (98.431 kg)  SpO2 97%  Wt Readings from Last 3 Encounters:  09/24/14 217 lb (98.431 kg)  07/24/14 228 lb 9.6 oz (103.692 kg)  07/23/14 226 lb (102.513 kg)    Physical Exam  Constitutional: He is oriented to person, place, and time. He appears well-developed and well-nourished. No distress.  HENT:  Head: Normocephalic and  atraumatic.  Right Ear: Hearing normal.  Left Ear: Hearing normal.  Nose: Nose normal.  Eyes: Conjunctivae and lids are normal. Right eye exhibits no discharge. Left eye exhibits no discharge. No scleral icterus.  Cardiovascular: Normal rate, regular rhythm and normal heart sounds.  Exam reveals no gallop and no friction rub.   No murmur heard. Pulmonary/Chest: Effort normal and breath sounds normal. No respiratory distress. He has no wheezes. He has no rales. He exhibits no tenderness.  Musculoskeletal: Normal range of motion.  Dusky toes, + pulses, warm  Neurological: He is alert and oriented to person, place, and time.  Skin: Skin is intact. No rash noted.  Psychiatric: He has a normal mood and affect. His speech is normal and behavior is normal. Judgment and thought content normal. Cognition and memory are normal.  Nursing note and vitals reviewed.      Assessment & Plan:   Problem List Items Addressed This Visit      Cardiovascular and Mediastinum   Essential hypertension    Under good control at this time. Continue current regimen. Continue to monitor. CMP checked today.       Relevant Medications   aspirin 81 MG tablet   Other Relevant Orders   Comprehensive metabolic panel     Other   Hyperlipidemia - Primary    Long discussion with patient today about the benefits of statins and that although there have been some questions about them recently, there are hundreds of studies showing their benefit. He is very concerned about developing dementia because of them. Discussed that this has not been proved. Will try 2 months  off cholesterol medicine to see if myalgias and memory issues improve, will check cholesterol again at that time. If elevated will need to go back on. Discussed with patient that even on his medicine, at this time he has a 12.5% 10 year risk of a cardiac event. He is aware, but wants to try coming off his cholesterol medicine.       Relevant Medications    aspirin 81 MG tablet   Other Relevant Orders   Comprehensive metabolic panel   Lipid Panel [place], Waived   LP+ALT+AST [place], [place]    Other Visit Diagnoses    Family history of heart disease        Would like to see cardiology. Referral made today.     Relevant Orders    Ambulatory referral to Cardiology    PVD (peripheral vascular disease)        Possibly due to hx of smoking. Would like to see vascular. Appointment made today.     Relevant Medications    aspirin 81 MG tablet    Other Relevant Orders    Ambulatory referral to Vascular Surgery        Follow up plan: Return in about 2 months (around 11/24/2014) for Follow up on cholesterol.

## 2014-09-24 NOTE — Assessment & Plan Note (Signed)
Long discussion with patient today about the benefits of statins and that although there have been some questions about them recently, there are hundreds of studies showing their benefit. He is very concerned about developing dementia because of them. Discussed that this has not been proved. Will try 2 months off cholesterol medicine to see if myalgias and memory issues improve, will check cholesterol again at that time. If elevated will need to go back on. Discussed with patient that even on his medicine, at this time he has a 12.5% 10 year risk of a cardiac event. He is aware, but wants to try coming off his cholesterol medicine.

## 2014-09-24 NOTE — Addendum Note (Signed)
Addended by: Valerie Roys on: 09/24/2014 10:17 AM   Modules accepted: Orders

## 2014-09-24 NOTE — Assessment & Plan Note (Signed)
Under good control at this time. Continue current regimen. Continue to monitor. CMP checked today.

## 2014-10-27 ENCOUNTER — Ambulatory Visit
Admission: RE | Admit: 2014-10-27 | Discharge: 2014-10-27 | Disposition: A | Discharge status: Home / Self Care | Payer: 59 | Source: Ambulatory Visit | Attending: Oncology | Admitting: Oncology

## 2014-10-27 ENCOUNTER — Inpatient Hospital Stay: Payer: 59 | Attending: Oncology

## 2014-10-27 DIAGNOSIS — R160 Hepatomegaly, not elsewhere classified: Secondary | ICD-10-CM | POA: Diagnosis not present

## 2014-10-27 DIAGNOSIS — R599 Enlarged lymph nodes, unspecified: Secondary | ICD-10-CM | POA: Insufficient documentation

## 2014-10-27 DIAGNOSIS — R161 Splenomegaly, not elsewhere classified: Secondary | ICD-10-CM | POA: Diagnosis not present

## 2014-10-27 DIAGNOSIS — N400 Enlarged prostate without lower urinary tract symptoms: Secondary | ICD-10-CM | POA: Diagnosis not present

## 2014-10-27 DIAGNOSIS — C8300 Small cell B-cell lymphoma, unspecified site: Secondary | ICD-10-CM

## 2014-10-27 DIAGNOSIS — C8581 Other specified types of non-Hodgkin lymphoma, lymph nodes of head, face, and neck: Secondary | ICD-10-CM | POA: Diagnosis not present

## 2014-10-27 LAB — CBC WITH DIFFERENTIAL/PLATELET
BASOS PCT: 1 %
Basophils Absolute: 0.1 10*3/uL (ref 0–0.1)
EOS PCT: 2 %
Eosinophils Absolute: 0.2 10*3/uL (ref 0–0.7)
HEMATOCRIT: 46.4 % (ref 40.0–52.0)
Hemoglobin: 15.7 g/dL (ref 13.0–18.0)
Lymphocytes Relative: 23 %
Lymphs Abs: 1.4 10*3/uL (ref 1.0–3.6)
MCH: 29.5 pg (ref 26.0–34.0)
MCHC: 33.8 g/dL (ref 32.0–36.0)
MCV: 87.3 fL (ref 80.0–100.0)
MONO ABS: 0.5 10*3/uL (ref 0.2–1.0)
MONOS PCT: 9 %
NEUTROS ABS: 4.2 10*3/uL (ref 1.4–6.5)
Neutrophils Relative %: 65 %
PLATELETS: 135 10*3/uL — AB (ref 150–440)
RBC: 5.32 MIL/uL (ref 4.40–5.90)
RDW: 12.4 % (ref 11.5–14.5)
WBC: 6.4 10*3/uL (ref 3.8–10.6)

## 2014-10-27 LAB — POCT I-STAT CREATININE: CREATININE: 1.1 mg/dL (ref 0.61–1.24)

## 2014-10-27 LAB — LACTATE DEHYDROGENASE: LDH: 201 U/L — ABNORMAL HIGH (ref 98–192)

## 2014-10-27 MED ORDER — IOHEXOL 300 MG/ML  SOLN
100.0000 mL | Freq: Once | INTRAMUSCULAR | Status: AC | PRN
  Administered 2014-10-27: 100 mL via INTRAVENOUS

## 2014-10-30 ENCOUNTER — Ambulatory Visit: Payer: 59 | Admitting: Oncology

## 2014-11-06 ENCOUNTER — Inpatient Hospital Stay: Payer: 59 | Attending: Oncology | Admitting: Oncology

## 2014-11-06 VITALS — BP 165/85 | HR 56 | Temp 96.7°F | Resp 20 | Wt 225.1 lb

## 2014-11-06 DIAGNOSIS — R160 Hepatomegaly, not elsewhere classified: Secondary | ICD-10-CM | POA: Diagnosis not present

## 2014-11-06 DIAGNOSIS — C8581 Other specified types of non-Hodgkin lymphoma, lymph nodes of head, face, and neck: Secondary | ICD-10-CM

## 2014-11-06 DIAGNOSIS — Z87891 Personal history of nicotine dependence: Secondary | ICD-10-CM | POA: Diagnosis not present

## 2014-11-06 DIAGNOSIS — Z7982 Long term (current) use of aspirin: Secondary | ICD-10-CM | POA: Diagnosis not present

## 2014-11-06 DIAGNOSIS — N400 Enlarged prostate without lower urinary tract symptoms: Secondary | ICD-10-CM | POA: Diagnosis not present

## 2014-11-06 DIAGNOSIS — Z79899 Other long term (current) drug therapy: Secondary | ICD-10-CM | POA: Diagnosis not present

## 2014-11-06 DIAGNOSIS — I10 Essential (primary) hypertension: Secondary | ICD-10-CM | POA: Insufficient documentation

## 2014-11-06 DIAGNOSIS — C8308 Small cell B-cell lymphoma, lymph nodes of multiple sites: Secondary | ICD-10-CM

## 2014-11-06 DIAGNOSIS — D696 Thrombocytopenia, unspecified: Secondary | ICD-10-CM | POA: Insufficient documentation

## 2014-11-06 NOTE — Progress Notes (Signed)
Patient here today for follow up regarding lymphoma, CT scan results. Patient denies any concerns today.

## 2014-11-22 NOTE — Progress Notes (Signed)
[facility]  [phone] [phone]  ID: DAL BLEW OB: [birth year]  MR#: [id number]  JME#:[id number]  Patient Care Team: Valerie Roys, DO as PCP - General (Family Medicine)  CHIEF COMPLAINT:  Chief Complaint  Patient presents with  . Lymphoma    INTERVAL HISTORY: Patient returns to clinic today for further evaluation, laboratory work, and discussion of his imaging results. He continues to feel well and remains asymptomatic. He denies any fevers, chills, or night sweats. He has no neurologic complaints. He has a good appetite and denies weight loss. He has no dysphasia. He denies any chest pain or shortness of breath. He denies any nausea, vomiting, constipation, or diarrhea. He has no urinary complaints. Patient offers no specific complaints today.  REVIEW OF SYSTEMS:   Review of Systems  Constitutional: Negative for fever and weight loss.  Respiratory: Negative.   Cardiovascular: Negative.   Gastrointestinal: Negative.   Musculoskeletal: Negative.   Neurological: Negative.  Negative for weakness.  Endo/Heme/Allergies: Does not bruise/bleed easily.    As per HPI. Otherwise, a complete review of systems is negatve.  PAST MEDICAL HISTORY: Past Medical History  Diagnosis Date  . Hypertension   . Low testosterone   . 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 Laser of prostate w/ green light pvp  2014  . Urethra and bladder unblocked  2012  . Hammer toe surgery  1998  . Appendectomy    . Prostate surgery    . Lymph node biopsy Left 03/2014    cancerous    FAMILY HISTORY Family History  Problem Relation Age of Onset  . Dementia Mother 74  . Hypertension Mother   . Hypertension Father   . Heart attack Father   . Heart disease Father   . Cancer Sister     breast  . Cancer Sister     breast  . Early death Paternal Grandfather   . Heart  disease Paternal Grandfather        ADVANCED DIRECTIVES:    HEALTH MAINTENANCE: Social History  Substance Use Topics  . Smoking status: Former Smoker    Quit date: 01/31/1996  . Smokeless tobacco: Never Used  . Alcohol Use: Yes     Comment: on occasion     Colonoscopy:  PAP:  Bone density:  Lipid panel:  No Known Allergies  Current Outpatient Prescriptions  Medication Sig Dispense Refill  . aspirin 81 MG tablet Take 81 mg by mouth daily.    . benazepril (LOTENSIN) 20 MG tablet Take 20 mg by mouth daily.    . Glucosamine-Chondroit-Vit C-Mn (GLUCOSAMINE 1500 COMPLEX PO) Take 1,500 mg by mouth daily.    . meloxicam (MOBIC) 15 MG tablet     . Omega-3 Fatty Acids (FISH OIL) 1200 MG CAPS Take 1,200 mg by mouth 1 day or 1 dose.    . Testosterone (TESTOPEL IL) by Implant route.    Marland Kitchen atorvastatin (LIPITOR) 20 MG tablet     . montelukast (SINGULAIR) 10 MG tablet Take 10 mg by mouth daily.     No current facility-administered medications for this visit.    OBJECTIVE: Filed Vitals:   11/06/14 0925  BP: 165/85  Pulse: 56  Temp: 96.7 F (35.9 C)  Resp: 20     Body mass index is 32.96 kg/(m^2).    ECOG FS:0 - Asymptomatic  General: Well-developed, well-nourished, no acute distress. Eyes:  anicteric sclera. HEENT: Minimally palpable lymphadenopathy on left neck. Lungs: Clear to auscultation bilaterally. Heart: Regular rate and rhythm. No rubs, murmurs, or gallops. Abdomen: Soft, nontender, nondistended. No organomegaly noted, normoactive bowel sounds. Musculoskeletal: No edema, cyanosis, or clubbing. Neuro: Alert, answering all questions appropriately. Cranial nerves grossly intact. Skin: No rashes or petechiae noted. Psych: Normal affect.   LAB RESULTS:  Lab Results  Component Value Date   NA 138 07/14/2014   K 4.3 07/14/2014   CL 103 07/14/2014   CO2 32 07/14/2014   GLUCOSE 110* 07/14/2014   BUN 16 07/14/2014   CREATININE 1.10 10/27/2014   CALCIUM 9.3  07/14/2014   GFRNONAA >60 07/14/2014   GFRAA >60 07/14/2014    Lab Results  Component Value Date   WBC 6.4 10/27/2014   NEUTROABS 4.2 10/27/2014   HGB 15.7 10/27/2014   HCT 46.4 10/27/2014   MCV 87.3 10/27/2014   PLT 135* 10/27/2014     STUDIES: Ct Soft Tissue Neck W Contrast  10/29/2014  CLINICAL DATA:  Small lymphocytic lymphoma. Restaging. Diagnosed 03/2014. No history of chemotherapy or radiation therapy. EXAM: CT NECK WITH CONTRAST TECHNIQUE: Multidetector CT imaging of the neck was performed using the standard protocol following the bolus administration of intravenous contrast. CONTRAST:  15mL OMNIPAQUE IOHEXOL 300 MG/ML  SOLN COMPARISON:  07/14/2014 FINDINGS: Pharynx and larynx: The nasopharynx, oropharynx, oral cavity, hypopharynx, and larynx are unremarkable. Salivary glands: Submandibular and parotid glands are unremarkable. Thyroid: Unremarkable. Lymph nodes: Enlarged lymph nodes are again seen throughout the neck bilaterally in levels II through V and have not significantly changed in size or number. Index lymph nodes reported on the prior study currently measure 1.2 cm in right level IIA (series 1, image 38), 1.5 cm in right level V (series 1, image 50, and 1.2 cm in left level III (series 1, image 58), all unchanged. Vascular: Major vascular structures of the neck are patent. Limited intracranial: The visualized portion of the brain is unremarkable. Visualized orbits: Unremarkable. Mastoids and visualized paranasal sinuses: Minimal right maxillary sinus mucosal thickening. Visualized mastoid air cells clear. Skeleton: Mild lower cervical disc degeneration is noted. There is also mild-to-moderate multilevel facet arthrosis including facet ankylosis bilaterally at C2-3 and C3-4. Upper chest: Evaluated on concurrent dedicated chest CT. IMPRESSION: Unchanged cervical lymphadenopathy. Electronically Signed   By: Logan Bores M.D.   On: 10/29/2014 08:32   Ct Chest W Contrast  10/27/2014   CLINICAL DATA:  Followup of chronic lymphocytic leukemia. No therapy. Prior prostate laser therapy. Appendectomy. EXAM: CT CHEST, ABDOMEN, AND PELVIS WITH CONTRAST TECHNIQUE: Multidetector CT imaging of the chest, abdomen and pelvis was performed following the standard protocol during bolus administration of intravenous contrast. CONTRAST:  171mL OMNIPAQUE IOHEXOL 300 MG/ML  SOLN COMPARISON:  07/14/2014 FINDINGS: CT CHEST FINDINGS Mediastinum/Nodes: Low cervical adenopathy, which will be detailed on dedicated neck CT, dictated separately. Bilateral axillary adenopathy. An index right axillary node measures 2.0 x 1.9 cm on image 22 versus 2.0 x 2.1 cm on the prior. Index left axillary node measures 2.4 x 1.8 cm on image 9 versus 2.7 x 1.9 cm on the prior. Heart size upper normal, without pericardial effusion. Tortuous, atherosclerotic thoracic aorta. No central pulmonary embolism, on this non-dedicated study. Mediastinal adenopathy. A periesophageal/ subcarinal node measures 1.7 cm on image 30 versus 1.8 cm on the prior. High right retrocrural node measures 1.3 cm today and is unchanged. Lungs/Pleura: No pleural fluid. Minimal subpleural nodularity in the superior segment left lower lobe on image  23 is unchanged. Musculoskeletal: No acute osseous abnormality. CT ABDOMEN PELVIS FINDINGS Hepatobiliary: Hepatomegaly, 21 cm craniocaudal. No focal liver lesion. Normal gallbladder, without biliary ductal dilatation. Pancreas: Normal, without mass or ductal dilatation. Spleen: Splenic size upper normal, 13 cm craniocaudal. No focal splenic abnormality. Adrenals/Urinary Tract: Normal adrenal glands. Normal kidneys, without hydronephrosis. Soft tissue fullness about the right-sided urinary bladder, including on image 119 of series 2 is similar and favored to be prostatic in origin. Stomach/Bowel: Normal stomach, without wall thickening. Normal colon and terminal ileum. Normal small bowel. Vascular/Lymphatic: Aortic and  branch vessel atherosclerosis. Circumaortic left renal vein. Retroperitoneal adenopathy. Index left periaortic node measures 2.1 cm on image 76 versus 2.0 cm on the prior. Mesenteric adenopathy is again identified. An index node measures 1.3 cm versus similar on the prior exam (when remeasured). Index right external iliac node is similar at 1.3 cm on image 112. A left external iliac node measures 1.6 cm on image 113 versus 1.5 cm on the prior. Reproductive: Enlarged, heterogeneous prostate. Asymmetric enlargement of the right side of the prostate, impressing into the urinary bladder on image 119 Other: No significant free fluid. Musculoskeletal: Degenerative disc disease at the lumbosacral junction. IMPRESSION: 1. Slight improvement in thoracic adenopathy. 2. Similar to minimal enlargement of abdominal pelvic adenopathy. 3. Heterogeneous asymmetrically enlarged prostate, worse on the right. This is grossly similar and could relate to benign prostatic hyperplasia. Correlate with urologic history. 4. Hepatomegaly with borderline splenomegaly. Electronically Signed   By: Abigail Miyamoto M.D.   On: 10/27/2014 12:05   Ct Abdomen Pelvis W Contrast  10/27/2014  CLINICAL DATA:  Followup of chronic lymphocytic leukemia. No therapy. Prior prostate laser therapy. Appendectomy. EXAM: CT CHEST, ABDOMEN, AND PELVIS WITH CONTRAST TECHNIQUE: Multidetector CT imaging of the chest, abdomen and pelvis was performed following the standard protocol during bolus administration of intravenous contrast. CONTRAST:  124mL OMNIPAQUE IOHEXOL 300 MG/ML  SOLN COMPARISON:  07/14/2014 FINDINGS: CT CHEST FINDINGS Mediastinum/Nodes: Low cervical adenopathy, which will be detailed on dedicated neck CT, dictated separately. Bilateral axillary adenopathy. An index right axillary node measures 2.0 x 1.9 cm on image 22 versus 2.0 x 2.1 cm on the prior. Index left axillary node measures 2.4 x 1.8 cm on image 9 versus 2.7 x 1.9 cm on the prior. Heart size  upper normal, without pericardial effusion. Tortuous, atherosclerotic thoracic aorta. No central pulmonary embolism, on this non-dedicated study. Mediastinal adenopathy. A periesophageal/ subcarinal node measures 1.7 cm on image 30 versus 1.8 cm on the prior. High right retrocrural node measures 1.3 cm today and is unchanged. Lungs/Pleura: No pleural fluid. Minimal subpleural nodularity in the superior segment left lower lobe on image 23 is unchanged. Musculoskeletal: No acute osseous abnormality. CT ABDOMEN PELVIS FINDINGS Hepatobiliary: Hepatomegaly, 21 cm craniocaudal. No focal liver lesion. Normal gallbladder, without biliary ductal dilatation. Pancreas: Normal, without mass or ductal dilatation. Spleen: Splenic size upper normal, 13 cm craniocaudal. No focal splenic abnormality. Adrenals/Urinary Tract: Normal adrenal glands. Normal kidneys, without hydronephrosis. Soft tissue fullness about the right-sided urinary bladder, including on image 119 of series 2 is similar and favored to be prostatic in origin. Stomach/Bowel: Normal stomach, without wall thickening. Normal colon and terminal ileum. Normal small bowel. Vascular/Lymphatic: Aortic and branch vessel atherosclerosis. Circumaortic left renal vein. Retroperitoneal adenopathy. Index left periaortic node measures 2.1 cm on image 76 versus 2.0 cm on the prior. Mesenteric adenopathy is again identified. An index node measures 1.3 cm versus similar on the prior exam (when remeasured). Index right  external iliac node is similar at 1.3 cm on image 112. A left external iliac node measures 1.6 cm on image 113 versus 1.5 cm on the prior. Reproductive: Enlarged, heterogeneous prostate. Asymmetric enlargement of the right side of the prostate, impressing into the urinary bladder on image 119 Other: No significant free fluid. Musculoskeletal: Degenerative disc disease at the lumbosacral junction. IMPRESSION: 1. Slight improvement in thoracic adenopathy. 2. Similar to  minimal enlargement of abdominal pelvic adenopathy. 3. Heterogeneous asymmetrically enlarged prostate, worse on the right. This is grossly similar and could relate to benign prostatic hyperplasia. Correlate with urologic history. 4. Hepatomegaly with borderline splenomegaly. Electronically Signed   By: Abigail Miyamoto M.D.   On: 10/27/2014 12:05    ASSESSMENT: Small lymphocytic lymphoma.  PLAN:    1. SLL: CT scan results as above reviewed independently with essentially stable disease. It is slightly unusual patient does not have a lymphocytosis concurrent with his lymphadenopathy. He only has a very mild thrombocytopenia and no B symptoms. This is typically a very indolent process and treatment is not required immediately. If patient requires treatment in the future, would consider single agent Rituxan. Return to clinic in 6 months with repeat imaging and further evaluation.  2. Thrombocytopenia: Mild. Likely secondary to underlying SLL. No intervention is needed.  Patient expressed understanding and was in agreement with this plan. He also understands that He can call clinic at any time with any questions, concerns, or complaints.    Lloyd Huger, MD   11/22/2014 8:17 AM

## 2014-11-23 ENCOUNTER — Ambulatory Visit (INDEPENDENT_AMBULATORY_CARE_PROVIDER_SITE_OTHER): Payer: 59 | Admitting: Family Medicine

## 2014-11-23 ENCOUNTER — Encounter: Payer: Self-pay | Admitting: Family Medicine

## 2014-11-23 VITALS — BP 136/82 | HR 65 | Temp 98.6°F | Ht 68.2 in | Wt 227.0 lb

## 2014-11-23 DIAGNOSIS — J301 Allergic rhinitis due to pollen: Secondary | ICD-10-CM

## 2014-11-23 DIAGNOSIS — Z23 Encounter for immunization: Secondary | ICD-10-CM

## 2014-11-23 DIAGNOSIS — E785 Hyperlipidemia, unspecified: Secondary | ICD-10-CM | POA: Diagnosis not present

## 2014-11-23 DIAGNOSIS — J309 Allergic rhinitis, unspecified: Secondary | ICD-10-CM | POA: Insufficient documentation

## 2014-11-23 LAB — LP+ALT+AST PICCOLO, WAIVED
ALT (SGPT) Piccolo, Waived: 25 U/L (ref 10–47)
AST (SGOT) Piccolo, Waived: 26 U/L (ref 11–38)
CHOL/HDL RATIO PICCOLO,WAIVE: 6.7 mg/dL — AB
Cholesterol Piccolo, Waived: 199 mg/dL (ref <200)
HDL CHOL PICCOLO, WAIVED: 30 mg/dL — AB (ref >59)
LDL Chol Calc Piccolo Waived: 130 mg/dL — ABNORMAL HIGH (ref <100)
TRIGLYCERIDES PICCOLO,WAIVED: 194 mg/dL — AB (ref <150)
VLDL CHOL CALC PICCOLO,WAIVE: 39 mg/dL — AB (ref <30)

## 2014-11-23 MED ORDER — MELOXICAM 15 MG PO TABS: 15.0000 mg | ORAL_TABLET | Freq: Every day | ORAL | Status: DC

## 2014-11-23 NOTE — Assessment & Plan Note (Signed)
Discussed again risk of cardiac event due to elevated cholesterol. Has myalgias on statins. Discussed zetia. He would like to hold off for now and recheck cholesterol at his physical in February through work. Will bring in those labs and we will discuss them at that time. He is aware of 15.8% risk, up from 12.5% on statin. Continue to monitor and follow up in Feburary.

## 2014-11-23 NOTE — Patient Instructions (Signed)

## 2014-11-23 NOTE — Progress Notes (Signed)
BP 136/82 mmHg  Pulse 65  Temp(Src) 98.6 F (37 C)  Ht 5' 8.2" (1.732 m)  Wt 227 lb (102.967 kg)  BMI 34.32 kg/m2  SpO2 96%   Subjective:    Patient ID: Jared Cowan, male    DOB: February 06, 1955, 59 y.o.   MRN: [id number]  HPI: Jared Cowan is a 59 y.o. male  Chief Complaint  Patient presents with  . Hyperlipidemia   HYPERLIPIDEMIA- feels like he is doing better off the cholesterol medication. He feels like his myalgias have improved.  Hyperlipidemia status: off medication at this time at his request Satisfied with current treatment?  yes Side effects:  no Medication compliance: Decided to come off the statin medication Past cholesterol meds: statin Supplements: fish oil Aspirin:  yes ASCD Risk: 15.8% Chest pain:  no Coronary artery disease:  no Family history CAD:  yes Family history early CAD:  yes  Relevant past medical, surgical, family and social history reviewed and updated as indicated. Interim medical history since our last visit reviewed. Allergies and medications reviewed and updated.  Review of Systems  Constitutional: Negative.   HENT: Positive for congestion and postnasal drip. Negative for dental problem, drooling, ear discharge, ear pain, facial swelling, hearing loss, mouth sores, nosebleeds, rhinorrhea, sinus pressure, sneezing, sore throat, tinnitus, trouble swallowing and voice change.   Respiratory: Negative.   Cardiovascular: Negative.   Psychiatric/Behavioral: Negative.     Per HPI unless specifically indicated above     Objective:    BP 136/82 mmHg  Pulse 65  Temp(Src) 98.6 F (37 C)  Ht 5' 8.2" (1.732 m)  Wt 227 lb (102.967 kg)  BMI 34.32 kg/m2  SpO2 96%  Wt Readings from Last 3 Encounters:  11/23/14 227 lb (102.967 kg)  11/06/14 225 lb 1.4 oz (102.1 kg)  09/24/14 217 lb (98.431 kg)    Physical Exam  Constitutional: He is oriented to person, place, and time. He appears well-developed and well-nourished. No distress.  HENT:   Head: Normocephalic and atraumatic.  Right Ear: Hearing and external ear normal.  Left Ear: Hearing and external ear normal.  Nose: Nose normal.  Mouth/Throat: Oropharynx is clear and moist. No oropharyngeal exudate.  Eyes: Conjunctivae and lids are normal. Pupils are equal, round, and reactive to light. Right eye exhibits no discharge. Left eye exhibits no discharge. No scleral icterus.  Cardiovascular: Normal rate, regular rhythm, normal heart sounds and intact distal pulses.  Exam reveals no gallop and no friction rub.   No murmur heard. Pulmonary/Chest: Effort normal and breath sounds normal. No respiratory distress. He has no wheezes. He has no rales. He exhibits no tenderness.  Musculoskeletal: Normal range of motion.  Neurological: He is alert and oriented to person, place, and time.  Skin: Skin is warm, dry and intact. No rash noted. No erythema. No pallor.  Psychiatric: He has a normal mood and affect. His speech is normal and behavior is normal. Judgment and thought content normal. Cognition and memory are normal.  Nursing note and vitals reviewed.   Results for orders placed or performed during the hospital encounter of 10/27/14  I-STAT creatinine  Result Value Ref Range   Creatinine, Ser 1.10 0.61 - 1.24 mg/dL      Assessment & Plan:   Problem List Items Addressed This Visit      Respiratory   Allergic rhinitis    Under good control on his flonase. Continue flonase. Continue to monitor.         Other  Hyperlipidemia - Primary    Discussed again risk of cardiac event due to elevated cholesterol. Has myalgias on statins. Discussed zetia. He would like to hold off for now and recheck cholesterol at his physical in February through work. Will bring in those labs and we will discuss them at that time. He is aware of 15.8% risk, up from 12.5% on statin. Continue to monitor and follow up in Feburary.       Other Visit Diagnoses    Immunization due        Flu and zoster  given today.    Relevant Orders    Flu Vaccine QUAD 36+ mos PF IM (Fluarix & Fluzone Quad PF) (Completed)    Varicella-zoster vaccine subcutaneous (Completed)        Follow up plan: Return End of Feb/March.

## 2014-11-23 NOTE — Assessment & Plan Note (Signed)
Under good control on his flonase. Continue flonase. Continue to monitor.

## 2014-12-10 ENCOUNTER — Ambulatory Visit: Payer: Self-pay | Admitting: Family Medicine

## 2014-12-10 ENCOUNTER — Encounter: Payer: Self-pay | Admitting: Family Medicine

## 2014-12-10 ENCOUNTER — Ambulatory Visit (INDEPENDENT_AMBULATORY_CARE_PROVIDER_SITE_OTHER): Payer: 59 | Admitting: Family Medicine

## 2014-12-10 VITALS — BP 150/87 | HR 68 | Temp 97.6°F | Ht 67.6 in | Wt 228.0 lb

## 2014-12-10 DIAGNOSIS — J301 Allergic rhinitis due to pollen: Secondary | ICD-10-CM

## 2014-12-10 DIAGNOSIS — B029 Zoster without complications: Secondary | ICD-10-CM | POA: Diagnosis not present

## 2014-12-10 MED ORDER — ALBUTEROL SULFATE HFA 108 (90 BASE) MCG/ACT IN AERS: 2.0000 | INHALATION_SPRAY | Freq: Four times a day (QID) | RESPIRATORY_TRACT | Status: DC | PRN

## 2014-12-10 MED ORDER — VALACYCLOVIR HCL 1 G PO TABS: 1000.0000 mg | ORAL_TABLET | Freq: Three times a day (TID) | ORAL | Status: DC

## 2014-12-10 MED ORDER — AMITRIPTYLINE HCL 25 MG PO TABS: 25.0000 mg | ORAL_TABLET | Freq: Every evening | ORAL | Status: DC | PRN

## 2014-12-10 NOTE — Patient Instructions (Signed)

## 2014-12-10 NOTE — Assessment & Plan Note (Signed)
Restart flonase and continue singulair. Will start albuterol for coarse breath sounds. He will let us know if not getting better.

## 2014-12-10 NOTE — Progress Notes (Signed)
BP 150/87 mmHg  Pulse 68  Temp(Src) 97.6 F (36.4 C)  Ht 5' 7.6" (1.717 m)  Wt 228 lb (103.42 kg)  BMI 35.08 kg/m2  SpO2 96%   Subjective:    Patient ID: Jared Cowan, male    DOB: 1955-03-07, 59 y.o.   MRN: [id number]  HPI: Jared Cowan is a 59 y.o. male  Chief Complaint  Patient presents with  . skin pain   SKIN PAIN Duration: 2 weeks Location: R hip, down R leg, across the lower back and backside, into the groin History of trauma in area: no Pain: yes- feels like sunburn or irritating with touch Quality: burning tingling pain Severity: 2/10 Redness: no Swelling: no Oozing: no Pus: no Fevers: no Nausea/vomiting: no  Has some bowel and bladder issues- hemorrhoids flared up, has been having some urgency and frequency, incontinence of urine with awareness + lower back pain Status: worse  Treatments attempted:eucerin, no benefit  Tetanus: UTD   ALLERGIES Duration: couple of weeks Runny nose: yes  Nasal congestion: yes Nasal itching: yes Sneezing: yes Eye swelling, itching or discharge: yes Post nasal drip: yes Cough: yes Sinus pressure: yes  Ear pain: no  Ear pressure: no  Fever: no  Symptoms occur seasonally: no Symptoms occur perenially: no Satisfied with current treatment: no Allergist evaluation in past: no Allergen injection immunotherapy: no Recurrent sinus infections: no ENT evaluation in past: no Known environmental allergy: yes- dog, which has been in the house Indoor pets: yes History of asthma: no Current allergy medications: flonase and singulair Treatments attempted: singulair   Relevant past medical, surgical, family and social history reviewed and updated as indicated. Interim medical history since our last visit reviewed. Allergies and medications reviewed and updated.  Review of Systems  Constitutional: Negative.   HENT: Positive for congestion, postnasal drip, rhinorrhea and sneezing. Negative for dental problem, drooling,  ear discharge, ear pain, facial swelling, hearing loss, nosebleeds, sinus pressure, sore throat, tinnitus, trouble swallowing and voice change.   Eyes: Positive for redness and itching. Negative for photophobia, pain, discharge and visual disturbance.  Respiratory: Positive for chest tightness, shortness of breath and wheezing. Negative for apnea, cough, choking and stridor.   Cardiovascular: Negative.   Psychiatric/Behavioral: Negative.     Per HPI unless specifically indicated above     Objective:    BP 150/87 mmHg  Pulse 68  Temp(Src) 97.6 F (36.4 C)  Ht 5' 7.6" (1.717 m)  Wt 228 lb (103.42 kg)  BMI 35.08 kg/m2  SpO2 96%  Wt Readings from Last 3 Encounters:  12/10/14 228 lb (103.42 kg)  11/23/14 227 lb (102.967 kg)  11/06/14 225 lb 1.4 oz (102.1 kg)    Physical Exam  Constitutional: He is oriented to person, place, and time. He appears well-developed and well-nourished. No distress.  HENT:  Head: Normocephalic and atraumatic.  Right Ear: Hearing, tympanic membrane, external ear and ear canal normal.  Left Ear: Hearing, tympanic membrane, external ear and ear canal normal.  Nose: Mucosal edema and rhinorrhea present. No nose lacerations, sinus tenderness, nasal deformity, septal deviation or nasal septal hematoma. No epistaxis.  No foreign bodies. Right sinus exhibits no maxillary sinus tenderness and no frontal sinus tenderness. Left sinus exhibits no maxillary sinus tenderness and no frontal sinus tenderness.  Mouth/Throat: Uvula is midline, oropharynx is clear and moist and mucous membranes are normal.  Eyes: Conjunctivae and lids are normal. Right eye exhibits no discharge. Left eye exhibits no discharge. No scleral icterus.  Cardiovascular:  Normal rate, regular rhythm, normal heart sounds and intact distal pulses.  Exam reveals no gallop and no friction rub.   No murmur heard. Pulmonary/Chest: Effort normal. No respiratory distress. He has decreased breath sounds in the  right upper field, the right middle field, the right lower field, the left upper field and the left middle field. He has no wheezes. He has no rhonchi. He has no rales. He exhibits no tenderness.  Musculoskeletal: Normal range of motion.  Neurological: He is alert and oriented to person, place, and time. He has normal strength and normal reflexes. He displays no atrophy and no tremor. No sensory deficit. He exhibits normal muscle tone. He displays no seizure activity. He displays no Babinski's sign on the right side. He displays no Babinski's sign on the left side.  Reflex Scores:      Patellar reflexes are 2+ on the right side and 2+ on the left side.      Achilles reflexes are 2+ on the right side and 2+ on the left side. Skin: Skin is warm, dry and intact. Rash noted. He is not diaphoretic. No cyanosis. No pallor. Nails show no clubbing.  1 vesicle on the back right around L1 dermatome  Psychiatric: He has a normal mood and affect. His speech is normal and behavior is normal. Judgment and thought content normal. Cognition and memory are normal.  Nursing note and vitals reviewed.   Results for orders placed or performed in visit on 11/23/14  LP+ALT+AST Piccolo, Waived  Result Value Ref Range   ALT (SGPT) Piccolo, Waived 25 10 - 47 U/L   AST (SGOT) Piccolo, Waived 26 11 - 38 U/L   Cholesterol Piccolo, Waived 199 <200 mg/dL   HDL Chol Piccolo, Waived 30 (L) >59 mg/dL   Triglycerides Piccolo,Waived 194 (H) <150 mg/dL   Chol/HDL Ratio Piccolo,Waive 6.7 (H) mg/dL   LDL Chol Calc Piccolo Waived 130 (H) <100 mg/dL   VLDL Chol Calc Piccolo,Waive 39 (H) <30 mg/dL      Assessment & Plan:   Problem List Items Addressed This Visit      Respiratory   Allergic rhinitis    Restart flonase and continue singulair. Will start albuterol for coarse breath sounds. He will let us know if not getting better.        Other Visit Diagnoses    Shingles    -  Primary    With vesicle in dermatome, will  treat with valacyclovir and amitriptyline. Call if not better by the time the medication is done and we will work up further    Relevant Medications    valACYclovir (VALTREX) 1000 MG tablet        Follow up plan: Return if symptoms worsen or fail to improve.

## 2015-01-05 ENCOUNTER — Other Ambulatory Visit: Payer: Self-pay | Admitting: Unknown Physician Specialty

## 2015-01-05 NOTE — Telephone Encounter (Signed)
Your patient.  Thanks 

## 2015-04-08 LAB — CBC AND DIFFERENTIAL
HEMATOCRIT: 48 % (ref 41–53)
HEMOGLOBIN: 16.7 g/dL (ref 13.5–17.5)
NEUTROS ABS: 3 /uL
Platelets: 144 10*3/uL — AB (ref 150–399)
WBC: 4.2 10*3/mL

## 2015-04-08 LAB — LIPID PANEL
CHOLESTEROL: 214 mg/dL — AB (ref 0–200)
HDL: 35 mg/dL (ref 35–70)
LDL CALC: 147 mg/dL
Triglycerides: 160 mg/dL (ref 40–160)

## 2015-04-08 LAB — BASIC METABOLIC PANEL
BUN: 18 mg/dL (ref 4–21)
Creatinine: 0.9 mg/dL (ref 0.6–1.3)
GLUCOSE: 94 mg/dL
Potassium: 4.6 mmol/L (ref 3.4–5.3)
SODIUM: 140 mmol/L (ref 137–147)

## 2015-04-08 LAB — HEPATIC FUNCTION PANEL
ALT: 33 U/L (ref 10–40)
AST: 21 U/L (ref 14–40)
Alkaline Phosphatase: 58 U/L (ref 25–125)
Bilirubin, Total: 0.7 mg/dL

## 2015-04-09 ENCOUNTER — Telehealth: Payer: Self-pay | Admitting: Oncology

## 2015-04-09 DIAGNOSIS — N5089 Other specified disorders of the male genital organs: Secondary | ICD-10-CM

## 2015-04-09 DIAGNOSIS — C8300 Small cell B-cell lymphoma, unspecified site: Secondary | ICD-10-CM

## 2015-04-09 NOTE — Telephone Encounter (Signed)
Patient lvm stating that he wants to know whether the CT he is scheduled for in early April will allow views of his scrotum because he has a mass on his scrotum. Please advise.

## 2015-04-09 NOTE — Telephone Encounter (Signed)
Possibly. But would recommend seeing his PCP for evaluation rather than waiting until April.

## 2015-04-12 ENCOUNTER — Other Ambulatory Visit: Payer: Self-pay | Admitting: *Deleted

## 2015-04-12 DIAGNOSIS — C8300 Small cell B-cell lymphoma, unspecified site: Secondary | ICD-10-CM

## 2015-04-12 DIAGNOSIS — N5089 Other specified disorders of the male genital organs: Secondary | ICD-10-CM

## 2015-04-12 NOTE — Telephone Encounter (Signed)
Would do a scrotal u/s first.

## 2015-04-12 NOTE — Telephone Encounter (Signed)
Per pt, the mass is soft and in the right scrotum. Order entered for [place] and order sent to scheduling

## 2015-04-12 NOTE — Telephone Encounter (Signed)
He just had a physical and was told he needs to have a CTof the area, he is scheduled for a CT CAP, will this include the scrotum? Or does he need to have a dedicated scrotal CT?

## 2015-04-13 ENCOUNTER — Encounter: Payer: Self-pay | Admitting: Family Medicine

## 2015-04-13 ENCOUNTER — Other Ambulatory Visit: Payer: Self-pay | Admitting: Family Medicine

## 2015-04-13 ENCOUNTER — Ambulatory Visit (INDEPENDENT_AMBULATORY_CARE_PROVIDER_SITE_OTHER): Payer: 59 | Admitting: Family Medicine

## 2015-04-13 VITALS — BP 140/76 | HR 54 | Temp 98.6°F | Ht 67.6 in | Wt 223.0 lb

## 2015-04-13 DIAGNOSIS — E785 Hyperlipidemia, unspecified: Secondary | ICD-10-CM | POA: Diagnosis not present

## 2015-04-13 DIAGNOSIS — M7502 Adhesive capsulitis of left shoulder: Secondary | ICD-10-CM | POA: Diagnosis not present

## 2015-04-13 DIAGNOSIS — I10 Essential (primary) hypertension: Secondary | ICD-10-CM

## 2015-04-13 MED ORDER — BENAZEPRIL HCL 20 MG PO TABS: 30.0000 mg | ORAL_TABLET | Freq: Every day | ORAL | Status: DC

## 2015-04-13 NOTE — Progress Notes (Signed)
BP 140/76 mmHg  Pulse 54  Temp(Src) 98.6 F (37 C)  Ht 5' 7.6" (1.717 m)  Wt 223 lb (101.152 kg)  BMI 34.31 kg/m2  SpO2 97%   Subjective:    Patient ID: Jared Cowan, male    DOB: [birth year], 60 y.o.   MRN: [id number]  HPI: Jared Cowan is a 60 y.o. male  Chief Complaint  Patient presents with  . Hypertension  . Hyperlipidemia   HYPERTENSION / HYPERLIPIDEMIA Satisfied with current treatment? yes Duration of hypertension: chronic BP monitoring frequency: a few times a week BP range: 120s- 150s, mainly in the 140s BP medication side effects: no Duration of hyperlipidemia: chronic Cholesterol medication side effects: yes- bad leg pain, stopped it because of side effects Cholesterol supplements: none Past cholesterol medications: atorvastain (lipitor) Aspirin: yes Recent stressors: no Recurrent headaches: no Visual changes: no Palpitations: no Dyspnea: no Chest pain: no Lower extremity edema: no Dizzy/lightheaded: no  L shoulder continuing to improve with PT. Slowly getting there.   Relevant past medical, surgical, family and social history reviewed and updated as indicated. Interim medical history since our last visit reviewed. Allergies and medications reviewed and updated.  Review of Systems  Constitutional: Negative.   Respiratory: Negative.   Cardiovascular: Negative.     Per HPI unless specifically indicated above     Objective:    BP 140/76 mmHg  Pulse 54  Temp(Src) 98.6 F (37 C)  Ht 5' 7.6" (1.717 m)  Wt 223 lb (101.152 kg)  BMI 34.31 kg/m2  SpO2 97%  Wt Readings from Last 3 Encounters:  04/13/15 223 lb (101.152 kg)  12/10/14 228 lb (103.42 kg)  11/23/14 227 lb (102.967 kg)    Physical Exam  Constitutional: He is oriented to person, place, and time. He appears well-developed and well-nourished. No distress.  HENT:  Head: Normocephalic and atraumatic.  Right Ear: Hearing normal.  Left Ear: Hearing normal.  Nose: Nose normal.   Eyes: Conjunctivae and lids are normal. Right eye exhibits no discharge. Left eye exhibits no discharge. No scleral icterus.  Cardiovascular: Normal rate, regular rhythm, normal heart sounds and intact distal pulses.  Exam reveals no gallop and no friction rub.   No murmur heard. Pulmonary/Chest: Effort normal and breath sounds normal. No respiratory distress. He has no wheezes. He has no rales. He exhibits no tenderness.  Musculoskeletal: He exhibits no edema or tenderness.  L shoulder decreased active ROM to abduction, slight catching with passive ROM, otherwise ROM good. No tenderness to palpation.   Neurological: He is alert and oriented to person, place, and time.  Skin: Skin is warm, dry and intact. No rash noted. No erythema. No pallor.  Psychiatric: He has a normal mood and affect. His speech is normal and behavior is normal. Judgment and thought content normal. Cognition and memory are normal.  Nursing note and vitals reviewed.   Results for orders placed or performed in visit on 04/13/15  CBC and differential  Result Value Ref Range   Hemoglobin 16.7 13.5 - 17.5 g/dL   HCT 48 41 - 53 %   Neutrophils Absolute 3 /L   Platelets 144 (A) 150 - 399 K/L   WBC 4.2 123456  Basic metabolic panel  Result Value Ref Range   Glucose 94 mg/dL   BUN 18 4 - 21 mg/dL   Creatinine 0.9 0.6 - 1.3 mg/dL   Potassium 4.6 3.4 - 5.3 mmol/L   Sodium 140 137 - 147 mmol/L  Lipid panel  Result Value Ref Range   Triglycerides 160 40 - 160 mg/dL   Cholesterol 214 (A) 0 - 200 mg/dL   HDL 35 35 - 70 mg/dL   LDL Cholesterol 147 mg/dL  Hepatic function panel  Result Value Ref Range   Alkaline Phosphatase 58 25 - 125 U/L   ALT 33 10 - 40 U/L   AST 21 14 - 40 U/L   Bilirubin, Total 0.7 mg/dL      Assessment & Plan:   Problem List Items Addressed This Visit      Cardiovascular and Mediastinum   Essential hypertension    Not under great control. Will increase to 30mg  daily and recheck in 1  month. Continue to monitor. Call with any problems.       Relevant Medications   benazepril (LOTENSIN) 20 MG tablet     Musculoskeletal and Integument   Frozen shoulder - Primary    To continue PT. Shoulder moving better. Continue to monitor. Call with any problems.         Other   Hyperlipidemia    Bad reaction on the atorvastatin. Doesn't want to take it. Risk 16.8%. He would like to continue to monitor. Recheck 6 months.       Relevant Medications   benazepril (LOTENSIN) 20 MG tablet       Follow up plan: Return in about 4 weeks (around 05/11/2015).

## 2015-04-13 NOTE — Assessment & Plan Note (Signed)
Bad reaction on the atorvastatin. Doesn't want to take it. Risk 16.8%. He would like to continue to monitor. Recheck 6 months.

## 2015-04-13 NOTE — Assessment & Plan Note (Signed)
To continue PT. Shoulder moving better. Continue to monitor. Call with any problems.

## 2015-04-13 NOTE — Assessment & Plan Note (Signed)
Not under great control. Will increase to 30mg  daily and recheck in 1 month. Continue to monitor. Call with any problems.

## 2015-04-14 ENCOUNTER — Ambulatory Visit: Payer: 59

## 2015-04-15 ENCOUNTER — Other Ambulatory Visit: Payer: Self-pay | Admitting: Oncology

## 2015-04-29 ENCOUNTER — Other Ambulatory Visit: Payer: Self-pay | Admitting: Oncology

## 2015-04-29 DIAGNOSIS — C8300 Small cell B-cell lymphoma, unspecified site: Secondary | ICD-10-CM

## 2015-05-05 ENCOUNTER — Ambulatory Visit
Admission: RE | Admit: 2015-05-05 | Discharge: 2015-05-05 | Disposition: A | Discharge status: Home / Self Care | Payer: 59 | Source: Ambulatory Visit | Attending: Oncology | Admitting: Oncology

## 2015-05-05 ENCOUNTER — Ambulatory Visit: Payer: 59

## 2015-05-05 ENCOUNTER — Inpatient Hospital Stay: Payer: 59 | Attending: Oncology

## 2015-05-05 DIAGNOSIS — C8581 Other specified types of non-Hodgkin lymphoma, lymph nodes of head, face, and neck: Secondary | ICD-10-CM | POA: Diagnosis not present

## 2015-05-05 DIAGNOSIS — Z87891 Personal history of nicotine dependence: Secondary | ICD-10-CM | POA: Insufficient documentation

## 2015-05-05 DIAGNOSIS — R161 Splenomegaly, not elsewhere classified: Secondary | ICD-10-CM | POA: Insufficient documentation

## 2015-05-05 DIAGNOSIS — C8308 Small cell B-cell lymphoma, lymph nodes of multiple sites: Secondary | ICD-10-CM | POA: Insufficient documentation

## 2015-05-05 DIAGNOSIS — N400 Enlarged prostate without lower urinary tract symptoms: Secondary | ICD-10-CM | POA: Diagnosis not present

## 2015-05-05 DIAGNOSIS — I10 Essential (primary) hypertension: Secondary | ICD-10-CM | POA: Diagnosis not present

## 2015-05-05 DIAGNOSIS — D696 Thrombocytopenia, unspecified: Secondary | ICD-10-CM | POA: Diagnosis not present

## 2015-05-05 DIAGNOSIS — Z7982 Long term (current) use of aspirin: Secondary | ICD-10-CM | POA: Insufficient documentation

## 2015-05-05 DIAGNOSIS — C8300 Small cell B-cell lymphoma, unspecified site: Secondary | ICD-10-CM

## 2015-05-05 DIAGNOSIS — Z79899 Other long term (current) drug therapy: Secondary | ICD-10-CM | POA: Diagnosis not present

## 2015-05-05 LAB — CBC WITH DIFFERENTIAL/PLATELET
BASOS PCT: 1 %
Basophils Absolute: 0 10*3/uL (ref 0–0.1)
Eosinophils Absolute: 0.1 10*3/uL (ref 0–0.7)
Eosinophils Relative: 2 %
HEMATOCRIT: 44.5 % (ref 40.0–52.0)
HEMOGLOBIN: 15.1 g/dL (ref 13.0–18.0)
LYMPHS ABS: 1.2 10*3/uL (ref 1.0–3.6)
LYMPHS PCT: 27 %
MCH: 29.6 pg (ref 26.0–34.0)
MCHC: 34 g/dL (ref 32.0–36.0)
MCV: 87 fL (ref 80.0–100.0)
MONO ABS: 0.5 10*3/uL (ref 0.2–1.0)
MONOS PCT: 11 %
NEUTROS ABS: 2.6 10*3/uL (ref 1.4–6.5)
NEUTROS PCT: 59 %
Platelets: 137 10*3/uL — ABNORMAL LOW (ref 150–440)
RBC: 5.11 MIL/uL (ref 4.40–5.90)
RDW: 13 % (ref 11.5–14.5)
WBC: 4.4 10*3/uL (ref 3.8–10.6)

## 2015-05-05 LAB — LACTATE DEHYDROGENASE: LDH: 156 U/L (ref 98–192)

## 2015-05-05 MED ORDER — IOHEXOL 300 MG/ML  SOLN
150.0000 mL | Freq: Once | INTRAMUSCULAR | Status: AC | PRN
  Administered 2015-05-05: 115 mL via INTRAVENOUS

## 2015-05-07 ENCOUNTER — Inpatient Hospital Stay (HOSPITAL_BASED_OUTPATIENT_CLINIC_OR_DEPARTMENT_OTHER): Payer: 59 | Admitting: Oncology

## 2015-05-07 VITALS — BP 138/83 | HR 57 | Temp 96.8°F | Resp 18 | Wt 227.7 lb

## 2015-05-07 DIAGNOSIS — C8581 Other specified types of non-Hodgkin lymphoma, lymph nodes of head, face, and neck: Secondary | ICD-10-CM

## 2015-05-07 DIAGNOSIS — Z79899 Other long term (current) drug therapy: Secondary | ICD-10-CM

## 2015-05-07 DIAGNOSIS — C8300 Small cell B-cell lymphoma, unspecified site: Secondary | ICD-10-CM

## 2015-05-07 DIAGNOSIS — D696 Thrombocytopenia, unspecified: Secondary | ICD-10-CM | POA: Diagnosis not present

## 2015-05-07 NOTE — Progress Notes (Signed)
Patient does not offer any problems today.  

## 2015-05-16 NOTE — Progress Notes (Signed)
Jared Cowan  [phone] [phone]) [phone]  ID: ELFEGO SEDOR OB: 1955-04-27  MR#: [id number]  [id number]  Patient Care Team: Valerie Roys, DO as PCP - General (Family Medicine)  CHIEF COMPLAINT:  Chief Complaint  Patient presents with  . Lymphoma  . Results    INTERVAL HISTORY: Patient returns to clinic today for further evaluation, laboratory work, and discussion of his imaging results. He continues to feel well and remains asymptomatic. He had an episode of scrotal swelling, but this resolved without medical intervention. He denies any fevers, chills, or night sweats. He has no neurologic complaints. He has a good appetite and denies weight loss. He has no dysphasia. He denies any chest pain or shortness of breath. He denies any nausea, vomiting, constipation, or diarrhea. He has no urinary complaints. Patient offers no specific complaints today.  REVIEW OF SYSTEMS:   Review of Systems  Constitutional: Negative for fever and weight loss.  Respiratory: Negative.   Cardiovascular: Negative.   Gastrointestinal: Negative.   Genitourinary: Negative.   Musculoskeletal: Negative.   Neurological: Negative.  Negative for weakness.  Endo/Heme/Allergies: Does not bruise/bleed easily.    As per HPI. Otherwise, a complete review of systems is negatve.  PAST MEDICAL HISTORY: Past Medical History  Diagnosis Date  . Hypertension   . Low testosterone   . Cancer ([place]) 03/2014    Small Lymphotic Lymphoma  . Sleep apnea     PAST SURGICAL HISTORY: Past Surgical History  Procedure Laterality Date  . Laser of prostate w/ green light pvp  2014  . Urethra and bladder unblocked  2012  . Hammer toe surgery  1998  . Appendectomy    . Prostate surgery    . Lymph node biopsy Left 03/2014    cancerous    FAMILY HISTORY Family History  Problem Relation Age of Onset  . Dementia Mother 91  . Hypertension Mother   . Hypertension Father   . Heart  attack Father   . Heart disease Father   . Cancer Sister     breast  . Cancer Sister     breast  . Early death Paternal Grandfather   . Heart disease Paternal Grandfather        ADVANCED DIRECTIVES:    HEALTH MAINTENANCE: Social History  Substance Use Topics  . Smoking status: Former Smoker    Quit date: 01/31/1996  . Smokeless tobacco: Never Used  . Alcohol Use: Yes     Comment: on occasion     Colonoscopy:  PAP:  Bone density:  Lipid panel:  No Known Allergies  Current Outpatient Prescriptions  Medication Sig Dispense Refill  . albuterol (PROVENTIL HFA;VENTOLIN HFA) 108 (90 BASE) MCG/ACT inhaler Inhale 2 puffs into the lungs every 6 (six) hours as needed for wheezing or shortness of breath. 1 Inhaler 0  . aspirin 81 MG tablet Take 81 mg by mouth daily.    . benazepril (LOTENSIN) 20 MG tablet Take 1.5 tablets (30 mg total) by mouth daily. 135 tablet 1  . CIALIS 20 MG tablet     . Glucosamine-Chondroit-Vit C-Mn (GLUCOSAMINE 1500 COMPLEX PO) Take 1,500 mg by mouth daily.    . meloxicam (MOBIC) 15 MG tablet Take 1 tablet (15 mg total) by mouth daily. 90 tablet 1  . Omega-3 Fatty Acids (FISH OIL) 1200 MG CAPS Take 1,200 mg by mouth 1 day or 1 dose.    . Testosterone (TESTOPEL IL) by Implant route.     No current  facility-administered medications for this visit.    OBJECTIVE: Filed Vitals:   05/07/15 0937  BP: 138/83  Pulse: 57  Temp: 96.8 F (36 C)  Resp: 18     Body mass index is 35.04 kg/(m^2).    ECOG FS:0 - Asymptomatic  General: Well-developed, well-nourished, no acute distress. Eyes: anicteric sclera. HEENT: Minimally palpable lymphadenopathy on left neck. Lungs: Clear to auscultation bilaterally. Heart: Regular rate and rhythm. No rubs, murmurs, or gallops. Abdomen: Soft, nontender, nondistended. No organomegaly noted, normoactive bowel sounds. Musculoskeletal: No edema, cyanosis, or clubbing. Neuro: Alert, answering all questions appropriately.  Cranial nerves grossly intact. Skin: No rashes or petechiae noted. Psych: Normal affect.   LAB RESULTS:  Lab Results  Component Value Date   NA 140 04/08/2015   K 4.6 04/08/2015   CL 103 07/14/2014   CO2 32 07/14/2014   GLUCOSE 110* 07/14/2014   BUN 18 04/08/2015   CREATININE 0.9 04/08/2015   CALCIUM 9.3 07/14/2014   AST 21 04/08/2015   ALT 33 04/08/2015   ALKPHOS 58 04/08/2015   GFRNONAA >60 07/14/2014   GFRAA >60 07/14/2014    Lab Results  Component Value Date   WBC 4.4 05/05/2015   NEUTROABS 2.6 05/05/2015   HGB 15.1 05/05/2015   HCT 44.5 05/05/2015   MCV 87.0 05/05/2015   PLT 137* 05/05/2015     STUDIES: Ct Chest W Contrast  05/05/2015  CLINICAL DATA:  B-cell lymphoma. Fullness in the right supraclavicular area. EXAM: CT CHEST, ABDOMEN, AND PELVIS WITH CONTRAST TECHNIQUE: Multidetector CT imaging of the chest, abdomen and pelvis was performed following the standard protocol during bolus administration of intravenous contrast. CONTRAST:  155mL OMNIPAQUE IOHEXOL 300 MG/ML  SOLN COMPARISON:  10/27/2014. FINDINGS: CT CHEST FINDINGS Mediastinum/Lymph Nodes: The supraclavicular, anterior scalene and low internal jugular lymph nodes measure up to 1.4 cm on the right (2/5), stable. Subcarinal lymph node measures 1.6 cm, stable. Otherwise, mediastinal and hilar lymph nodes are not enlarged by CT size criteria. Bilateral axillary and subpectoral lymph nodes measure up to 1.7 cm on the right (2/26), previously 1.9 cm. Lymph nodes along the course of the esophagus measure up to 1.3 cm (2/54), stable. Heart size normal. No pericardial effusion. Lungs/Pleura: Minimal biapical pleural parenchymal scarring and paraseptal emphysema. Calcified granuloma in the left lower lobe. Lungs are otherwise clear. No pleural fluid. Airway is unremarkable. Musculoskeletal: No worrisome lytic or sclerotic lesions. CT ABDOMEN PELVIS FINDINGS Hepatobiliary: Liver and gallbladder are unremarkable. No biliary  ductal dilatation. Pancreas: Negative. Spleen: 5 mm low-attenuation lesion in the anterior spleen is too small to characterize and nonspecific. Spleen measures 14 cm. Adrenals/Urinary Tract: Adrenal glands and kidneys are unremarkable. Ureters are decompressed. Bladder is grossly unremarkable. Stomach/Bowel: Stomach, small bowel and colon are unremarkable. Appendix is not readily visualized. Vascular/Lymphatic: Atherosclerotic calcification of the arterial vasculature without abdominal aortic aneurysm. Porta hepatis lymph nodes measure up to 1.4 cm, stable. Retroperitoneal lymph nodes measure up to 2.0 cm in the left periaortic station (2/82), stable. There are numerous small bowel mesenteric lymph nodes, which measure up to approximately 11 mm, stable. Lymph nodes along the iliac chains measure up to 1.9 cm in the left external iliac station (2/119), stable. No inguinal adenopathy. Reproductive: Prostate is mildly enlarged. Other: No free fluid. Mesenteries and peritoneum are otherwise unremarkable. Musculoskeletal: No worrisome lytic or sclerotic lesions. IMPRESSION: 1. Adenopathy throughout the low neck, chest, abdomen and pelvis is unchanged from 10/27/2014. 2. Borderline splenic enlargement. 3. Mild prostate enlargement. Electronically Signed  By: Lorin Picket M.D.   On: 05/05/2015 10:40   Ct Abdomen Pelvis W Contrast  05/05/2015  CLINICAL DATA:  B-cell lymphoma. Fullness in the right supraclavicular area. EXAM: CT CHEST, ABDOMEN, AND PELVIS WITH CONTRAST TECHNIQUE: Multidetector CT imaging of the chest, abdomen and pelvis was performed following the standard protocol during bolus administration of intravenous contrast. CONTRAST:  177mL OMNIPAQUE IOHEXOL 300 MG/ML  SOLN COMPARISON:  10/27/2014. FINDINGS: CT CHEST FINDINGS Mediastinum/Lymph Nodes: The supraclavicular, anterior scalene and low internal jugular lymph nodes measure up to 1.4 cm on the right (2/5), stable. Subcarinal lymph node measures 1.6  cm, stable. Otherwise, mediastinal and hilar lymph nodes are not enlarged by CT size criteria. Bilateral axillary and subpectoral lymph nodes measure up to 1.7 cm on the right (2/26), previously 1.9 cm. Lymph nodes along the course of the esophagus measure up to 1.3 cm (2/54), stable. Heart size normal. No pericardial effusion. Lungs/Pleura: Minimal biapical pleural parenchymal scarring and paraseptal emphysema. Calcified granuloma in the left lower lobe. Lungs are otherwise clear. No pleural fluid. Airway is unremarkable. Musculoskeletal: No worrisome lytic or sclerotic lesions. CT ABDOMEN PELVIS FINDINGS Hepatobiliary: Liver and gallbladder are unremarkable. No biliary ductal dilatation. Pancreas: Negative. Spleen: 5 mm low-attenuation lesion in the anterior spleen is too small to characterize and nonspecific. Spleen measures 14 cm. Adrenals/Urinary Tract: Adrenal glands and kidneys are unremarkable. Ureters are decompressed. Bladder is grossly unremarkable. Stomach/Bowel: Stomach, small bowel and colon are unremarkable. Appendix is not readily visualized. Vascular/Lymphatic: Atherosclerotic calcification of the arterial vasculature without abdominal aortic aneurysm. Porta hepatis lymph nodes measure up to 1.4 cm, stable. Retroperitoneal lymph nodes measure up to 2.0 cm in the left periaortic station (2/82), stable. There are numerous small bowel mesenteric lymph nodes, which measure up to approximately 11 mm, stable. Lymph nodes along the iliac chains measure up to 1.9 cm in the left external iliac station (2/119), stable. No inguinal adenopathy. Reproductive: Prostate is mildly enlarged. Other: No free fluid. Mesenteries and peritoneum are otherwise unremarkable. Musculoskeletal: No worrisome lytic or sclerotic lesions. IMPRESSION: 1. Adenopathy throughout the low neck, chest, abdomen and pelvis is unchanged from 10/27/2014. 2. Borderline splenic enlargement. 3. Mild prostate enlargement. Electronically Signed    By: Lorin Picket M.D.   On: 05/05/2015 10:40    ASSESSMENT: Small lymphocytic lymphoma.  PLAN:    1. SLL: CT scan results as above reviewed independently with essentially stable disease. It is slightly unusual patient does not have a lymphocytosis concurrent with his lymphadenopathy. He only has a very mild thrombocytopenia and no B symptoms. This is typically a very indolent process and treatment is not required immediately. If patient requires treatment in the future, would consider single agent Rituxan. Return to clinic in 6 months with repeat imaging and further evaluation.  2. Thrombocytopenia: Mild. Likely secondary to underlying SLL. No intervention is needed.  Patient expressed understanding and was in agreement with this plan. He also understands that He can call clinic at any time with any questions, concerns, or complaints.    Lloyd Huger, MD   05/16/2015 11:30 PM

## 2015-06-22 ENCOUNTER — Ambulatory Visit (INDEPENDENT_AMBULATORY_CARE_PROVIDER_SITE_OTHER): Payer: 59 | Admitting: Family Medicine

## 2015-06-22 ENCOUNTER — Other Ambulatory Visit: Payer: Self-pay | Admitting: Family Medicine

## 2015-06-22 ENCOUNTER — Encounter: Payer: Self-pay | Admitting: Family Medicine

## 2015-06-22 VITALS — BP 160/89 | HR 61 | Temp 98.0°F | Ht 68.25 in | Wt 231.0 lb

## 2015-06-22 DIAGNOSIS — R635 Abnormal weight gain: Secondary | ICD-10-CM

## 2015-06-22 DIAGNOSIS — I10 Essential (primary) hypertension: Secondary | ICD-10-CM

## 2015-06-22 MED ORDER — HYDROCHLOROTHIAZIDE 25 MG PO TABS: 25.0000 mg | ORAL_TABLET | Freq: Every day | ORAL | Status: DC

## 2015-06-22 NOTE — Progress Notes (Signed)
BP 160/89 mmHg  Pulse 61  Temp(Src) 98 F (36.7 C)  Ht 5' 8.25" (1.734 m)  Wt 231 lb (104.781 kg)  BMI 34.85 kg/m2  SpO2 97%   Subjective:    Patient ID: Jared Cowan, male    DOB: [birth year], 60 y.o.   MRN: [id number]  HPI: Jared Cowan is a 60 y.o. male  Chief Complaint  Patient presents with  . Weight Gain   Gained about 10lbs over the course of 2 weeks. No changes in his diet. Exercised more in April than in May. Pants are tighter and ring is tighter, but has not noticed any swelling in the legs or ankles. Has not been on anything recently that would have made him gain weight. No changed in medicine. Otherwise feeling well. He notes that he is a bit more tired than usual. No constipation. Has been eating more cheese and pretzels- so isn't sure if that was doing it. Otherwise doing well with no other concerns or complaints at this time.   Relevant past medical, surgical, family and social history reviewed and updated as indicated. Interim medical history since our last visit reviewed. Allergies and medications reviewed and updated.  Review of Systems  Constitutional: Positive for unexpected weight change. Negative for fever, chills, diaphoresis, activity change, appetite change and fatigue.  Respiratory: Negative.   Cardiovascular: Negative.   Psychiatric/Behavioral: Negative.    Per HPI unless specifically indicated above     Objective:    BP 160/89 mmHg  Pulse 61  Temp(Src) 98 F (36.7 C)  Ht 5' 8.25" (1.734 m)  Wt 231 lb (104.781 kg)  BMI 34.85 kg/m2  SpO2 97%  Wt Readings from Last 3 Encounters:  06/22/15 231 lb (104.781 kg)  05/07/15 227 lb 11.8 oz (103.3 kg)  04/13/15 223 lb (101.152 kg)    Physical Exam  Constitutional: He is oriented to person, place, and time. He appears well-developed and well-nourished. No distress.  HENT:  Head: Normocephalic and atraumatic.  Right Ear: Hearing normal.  Left Ear: Hearing normal.  Nose: Nose normal.  Eyes:  Conjunctivae and lids are normal. Right eye exhibits no discharge. Left eye exhibits no discharge. No scleral icterus.  Cardiovascular: Normal rate, regular rhythm, normal heart sounds and intact distal pulses.  Exam reveals no gallop and no friction rub.   No murmur heard. Pulmonary/Chest: Effort normal and breath sounds normal. No respiratory distress. He has no wheezes. He has no rales. He exhibits no tenderness.  Musculoskeletal: Normal range of motion. He exhibits no edema or tenderness.  Neurological: He is alert and oriented to person, place, and time.  Skin: Skin is warm, dry and intact. No rash noted. No erythema. No pallor.  Psychiatric: He has a normal mood and affect. His speech is normal and behavior is normal. Judgment and thought content normal. Cognition and memory are normal.  Nursing note and vitals reviewed.   Results for orders placed or performed in visit on 05/05/15  CBC with Differential/Platelet  Result Value Ref Range   WBC 4.4 3.8 - 10.6 K/uL   RBC 5.11 4.40 - 5.90 MIL/uL   Hemoglobin 15.1 13.0 - 18.0 g/dL   HCT 44.5 40.0 - 52.0 %   MCV 87.0 80.0 - 100.0 fL   MCH 29.6 26.0 - 34.0 pg   MCHC 34.0 32.0 - 36.0 g/dL   RDW 13.0 11.5 - 14.5 %   Platelets 137 (L) 150 - 440 K/uL   Neutrophils Relative % 59 %  Neutro Abs 2.6 1.4 - 6.5 K/uL   Lymphocytes Relative 27 %   Lymphs Abs 1.2 1.0 - 3.6 K/uL   Monocytes Relative 11 %   Monocytes Absolute 0.5 0.2 - 1.0 K/uL   Eosinophils Relative 2 %   Eosinophils Absolute 0.1 0 - 0.7 K/uL   Basophils Relative 1 %   Basophils Absolute 0.0 0 - 0.1 K/uL  Lactate dehydrogenase  Result Value Ref Range   LDH 156 98 - 192 U/L      Assessment & Plan:   Problem List Items Addressed This Visit      Cardiovascular and Mediastinum   Essential hypertension    Not under good control. Will start HCTZ. Call with any concerns. Recheck 1 month.       Relevant Medications   hydrochlorothiazide (HYDRODIURIL) 25 MG tablet    Other  Visit Diagnoses    Weight gain    -  Primary    Likely due to excess calories. Will check labs. Await results. Call with any concerns.         Follow up plan: Return in about 4 weeks (around 07/20/2015) for follow up BP and weight.

## 2015-06-22 NOTE — Assessment & Plan Note (Signed)
Not under good control. Will start HCTZ. Call with any concerns. Recheck 1 month.

## 2015-06-23 LAB — COMPREHENSIVE METABOLIC PANEL
ALBUMIN: 4.5 g/dL (ref 3.6–4.8)
ALK PHOS: 51 IU/L (ref 39–117)
ALT: 19 IU/L (ref 0–44)
AST: 19 IU/L (ref 0–40)
Albumin/Globulin Ratio: 1.9 (ref 1.2–2.2)
BILIRUBIN TOTAL: 0.8 mg/dL (ref 0.0–1.2)
BUN / CREAT RATIO: 18 (ref 10–24)
BUN: 17 mg/dL (ref 8–27)
CHLORIDE: 99 mmol/L (ref 96–106)
CO2: 22 mmol/L (ref 18–29)
Calcium: 9.7 mg/dL (ref 8.6–10.2)
Creatinine, Ser: 0.93 mg/dL (ref 0.76–1.27)
GFR calc Af Amer: 103 mL/min/{1.73_m2} (ref >59)
GFR calc non Af Amer: 89 mL/min/{1.73_m2} (ref >59)
GLOBULIN, TOTAL: 2.4 g/dL (ref 1.5–4.5)
GLUCOSE: 83 mg/dL (ref 65–99)
Potassium: 4.1 mmol/L (ref 3.5–5.2)
SODIUM: 138 mmol/L (ref 134–144)
Total Protein: 6.9 g/dL (ref 6.0–8.5)

## 2015-06-23 LAB — THYROID PANEL WITH TSH
Free Thyroxine Index: 2.4 (ref 1.2–4.9)
T3 Uptake Ratio: 38 % (ref 24–39)
T4 TOTAL: 6.2 ug/dL (ref 4.5–12.0)
TSH: 2.1 u[IU]/mL (ref 0.450–4.500)

## 2015-07-22 ENCOUNTER — Ambulatory Visit: Payer: 59 | Admitting: Family Medicine

## 2015-08-16 ENCOUNTER — Other Ambulatory Visit: Payer: Self-pay | Admitting: Family Medicine

## 2015-09-01 ENCOUNTER — Telehealth: Payer: Self-pay | Admitting: Oncology

## 2015-09-01 NOTE — Telephone Encounter (Signed)
It looks like patient should already be scheduled for scans of neck/chest/abdomen/pelvis.

## 2015-09-01 NOTE — Telephone Encounter (Signed)
We can include CT of his neck if it' snot already ordered.  Thanks.

## 2015-09-01 NOTE — Telephone Encounter (Signed)
Scheduling instructions were entered on 05/07/15. I was on vacation, scheduler must not have seen the scheduling note and it was not done. I will fix it. Thanks.

## 2015-09-01 NOTE — Telephone Encounter (Signed)
Jared Cowan, patient has follow up scheduled in [place] on 10/6. Dr. Gary Fleet last office note and check out note indicate patient should be scheduled for labs/CT scans prior to this appointment on 10/6. Can you please check into this to find out why patient is not scheduled for lab visit and CT scans prior to his MD visit? If this is not scheduled patient will need to be set up for lab only with CT scans about 1 week prior to his follow up on 10/6. Thank you.

## 2015-09-01 NOTE — Telephone Encounter (Signed)
Patient wants you to please call him (preferably NOT today on 09/01/15 but maybe 8/3 or 8/4) to discuss changing the scan orders to be able to get a higher area of his body. ENT said possibly swollen lymph node rubbing against audial nerve,and sometimes when he's walking and his heels make impact he hears a sound like brushes on a cymbal. [phone] and you can lvm or tell a time you'll call back.

## 2015-09-24 ENCOUNTER — Telehealth: Payer: Self-pay | Admitting: Family Medicine

## 2015-09-24 NOTE — Telephone Encounter (Signed)
FYI

## 2015-09-24 NOTE — Telephone Encounter (Signed)
Pt called and stated that he would like to get his flu chot, hep C screening as well as have his testosterone levels checked during his appt 09/27/15.

## 2015-09-27 ENCOUNTER — Ambulatory Visit (INDEPENDENT_AMBULATORY_CARE_PROVIDER_SITE_OTHER): Payer: 59 | Admitting: Family Medicine

## 2015-09-27 ENCOUNTER — Encounter: Payer: Self-pay | Admitting: Family Medicine

## 2015-09-27 VITALS — BP 121/77 | HR 60 | Temp 98.4°F | Wt 227.0 lb

## 2015-09-27 DIAGNOSIS — Z1159 Encounter for screening for other viral diseases: Secondary | ICD-10-CM

## 2015-09-27 DIAGNOSIS — Z23 Encounter for immunization: Secondary | ICD-10-CM

## 2015-09-27 DIAGNOSIS — E291 Testicular hypofunction: Secondary | ICD-10-CM | POA: Diagnosis not present

## 2015-09-27 DIAGNOSIS — I10 Essential (primary) hypertension: Secondary | ICD-10-CM

## 2015-09-27 DIAGNOSIS — E785 Hyperlipidemia, unspecified: Secondary | ICD-10-CM | POA: Diagnosis not present

## 2015-09-27 DIAGNOSIS — R635 Abnormal weight gain: Secondary | ICD-10-CM | POA: Diagnosis not present

## 2015-09-27 DIAGNOSIS — R7989 Other specified abnormal findings of blood chemistry: Secondary | ICD-10-CM | POA: Insufficient documentation

## 2015-09-27 NOTE — Assessment & Plan Note (Signed)
Rechecking levels today. Await results.  

## 2015-09-27 NOTE — Assessment & Plan Note (Signed)
Under good control on HCTZ. Checking labs. Continue to monitor.

## 2015-09-27 NOTE — Patient Instructions (Addendum)
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
## 2015-09-27 NOTE — Assessment & Plan Note (Signed)
Followed by urology. On testapel. Labs checked today. Will forward them to Dr. Rogers Blocker when they come back.

## 2015-09-28 LAB — CBC WITH DIFFERENTIAL/PLATELET
BASOS ABS: 0 10*3/uL (ref 0.0–0.2)
BASOS: 0 %
EOS (ABSOLUTE): 0.1 10*3/uL (ref 0.0–0.4)
Eos: 2 %
Hematocrit: 50.7 % (ref 37.5–51.0)
Hemoglobin: 17.9 g/dL — ABNORMAL HIGH (ref 12.6–17.7)
IMMATURE GRANS (ABS): 0 10*3/uL (ref 0.0–0.1)
IMMATURE GRANULOCYTES: 0 %
LYMPHS: 24 %
Lymphocytes Absolute: 1.1 10*3/uL (ref 0.7–3.1)
MCH: 31 pg (ref 26.6–33.0)
MCHC: 35.3 g/dL (ref 31.5–35.7)
MCV: 88 fL (ref 79–97)
Monocytes Absolute: 0.5 10*3/uL (ref 0.1–0.9)
Monocytes: 10 %
NEUTROS PCT: 64 %
Neutrophils Absolute: 3.1 10*3/uL (ref 1.4–7.0)
PLATELETS: 159 10*3/uL (ref 150–379)
RBC: 5.78 x10E6/uL (ref 4.14–5.80)
RDW: 12.7 % (ref 12.3–15.4)
WBC: 4.8 10*3/uL (ref 3.4–10.8)

## 2015-09-28 LAB — COMPREHENSIVE METABOLIC PANEL
A/G RATIO: 1.8 (ref 1.2–2.2)
ALT: 23 IU/L (ref 0–44)
AST: 20 IU/L (ref 0–40)
Albumin: 4.4 g/dL (ref 3.6–4.8)
Alkaline Phosphatase: 62 IU/L (ref 39–117)
BUN/Creatinine Ratio: 17 (ref 10–24)
BUN: 19 mg/dL (ref 8–27)
Bilirubin Total: 0.5 mg/dL (ref 0.0–1.2)
CALCIUM: 9.4 mg/dL (ref 8.6–10.2)
CO2: 22 mmol/L (ref 18–29)
CREATININE: 1.13 mg/dL (ref 0.76–1.27)
Chloride: 98 mmol/L (ref 96–106)
GFR, EST AFRICAN AMERICAN: 81 mL/min/{1.73_m2} (ref >59)
GFR, EST NON AFRICAN AMERICAN: 70 mL/min/{1.73_m2} (ref >59)
GLOBULIN, TOTAL: 2.5 g/dL (ref 1.5–4.5)
Glucose: 105 mg/dL — ABNORMAL HIGH (ref 65–99)
POTASSIUM: 4.2 mmol/L (ref 3.5–5.2)
SODIUM: 139 mmol/L (ref 134–144)
TOTAL PROTEIN: 6.9 g/dL (ref 6.0–8.5)

## 2015-09-28 LAB — LIPID PANEL W/O CHOL/HDL RATIO
Cholesterol, Total: 191 mg/dL (ref 100–199)
HDL: 30 mg/dL — AB (ref >39)
LDL CALC: 124 mg/dL — AB (ref 0–99)
TRIGLYCERIDES: 186 mg/dL — AB (ref 0–149)
VLDL Cholesterol Cal: 37 mg/dL (ref 5–40)

## 2015-09-28 LAB — HEPATITIS C ANTIBODY: Hep C Virus Ab: <0.1 s/co ratio (ref 0.0–0.9)

## 2015-09-28 LAB — PSA: PROSTATE SPECIFIC AG, SERUM: 1 ng/mL (ref 0.0–4.0)

## 2015-09-28 LAB — TESTOSTERONE, FREE, TOTAL, SHBG
Sex Hormone Binding: 23 nmol/L (ref 19.3–76.4)
Testosterone, Free: 11.2 pg/mL (ref 6.6–18.1)
Testosterone: 253 ng/dL — ABNORMAL LOW (ref 264–916)

## 2015-10-29 ENCOUNTER — Other Ambulatory Visit: Payer: 59

## 2015-10-29 ENCOUNTER — Ambulatory Visit: Payer: 59

## 2015-11-02 ENCOUNTER — Other Ambulatory Visit: Payer: Self-pay | Admitting: *Deleted

## 2015-11-02 ENCOUNTER — Ambulatory Visit
Admission: RE | Admit: 2015-11-02 | Discharge: 2015-11-02 | Disposition: A | Discharge status: Home / Self Care | Payer: 59 | Source: Ambulatory Visit | Attending: Oncology | Admitting: Oncology

## 2015-11-02 ENCOUNTER — Inpatient Hospital Stay: Payer: 59 | Attending: Oncology

## 2015-11-02 ENCOUNTER — Ambulatory Visit: Payer: 59

## 2015-11-02 DIAGNOSIS — C8300 Small cell B-cell lymphoma, unspecified site: Secondary | ICD-10-CM

## 2015-11-02 DIAGNOSIS — C8303 Small cell B-cell lymphoma, intra-abdominal lymph nodes: Secondary | ICD-10-CM | POA: Diagnosis not present

## 2015-11-02 DIAGNOSIS — Z79899 Other long term (current) drug therapy: Secondary | ICD-10-CM | POA: Insufficient documentation

## 2015-11-02 DIAGNOSIS — Z7982 Long term (current) use of aspirin: Secondary | ICD-10-CM | POA: Diagnosis not present

## 2015-11-02 DIAGNOSIS — Z87891 Personal history of nicotine dependence: Secondary | ICD-10-CM | POA: Diagnosis not present

## 2015-11-02 DIAGNOSIS — I10 Essential (primary) hypertension: Secondary | ICD-10-CM | POA: Diagnosis not present

## 2015-11-02 DIAGNOSIS — G4730 Sleep apnea, unspecified: Secondary | ICD-10-CM | POA: Diagnosis not present

## 2015-11-02 LAB — COMPREHENSIVE METABOLIC PANEL
ALK PHOS: 52 U/L (ref 38–126)
ALT: 42 U/L (ref 17–63)
AST: 28 U/L (ref 15–41)
Albumin: 4.4 g/dL (ref 3.5–5.0)
Anion gap: 6 (ref 5–15)
BUN: 20 mg/dL (ref 6–20)
CALCIUM: 9.5 mg/dL (ref 8.9–10.3)
CHLORIDE: 99 mmol/L — AB (ref 101–111)
CO2: 30 mmol/L (ref 22–32)
CREATININE: 1.2 mg/dL (ref 0.61–1.24)
GFR calc Af Amer: >60 mL/min (ref >60)
Glucose, Bld: 106 mg/dL — ABNORMAL HIGH (ref 65–99)
Potassium: 4.3 mmol/L (ref 3.5–5.1)
Sodium: 135 mmol/L (ref 135–145)
Total Bilirubin: 0.9 mg/dL (ref 0.3–1.2)
Total Protein: 7.5 g/dL (ref 6.5–8.1)

## 2015-11-02 LAB — CBC WITH DIFFERENTIAL/PLATELET
Basophils Absolute: 0 10*3/uL (ref 0–0.1)
Basophils Relative: 1 %
EOS ABS: 0.1 10*3/uL (ref 0–0.7)
Eosinophils Relative: 1 %
HEMATOCRIT: 52.2 % — AB (ref 40.0–52.0)
HEMOGLOBIN: 17.6 g/dL (ref 13.0–18.0)
LYMPHS ABS: 1.2 10*3/uL (ref 1.0–3.6)
LYMPHS PCT: 17 %
MCH: 30.1 pg (ref 26.0–34.0)
MCHC: 33.8 g/dL (ref 32.0–36.0)
MCV: 89 fL (ref 80.0–100.0)
MONOS PCT: 10 %
Monocytes Absolute: 0.7 10*3/uL (ref 0.2–1.0)
NEUTROS ABS: 4.8 10*3/uL (ref 1.4–6.5)
NEUTROS PCT: 71 %
Platelets: 156 10*3/uL (ref 150–440)
RBC: 5.87 MIL/uL (ref 4.40–5.90)
RDW: 13.4 % (ref 11.5–14.5)
WBC: 6.8 10*3/uL (ref 3.8–10.6)

## 2015-11-02 LAB — LACTATE DEHYDROGENASE: LDH: 145 U/L (ref 98–192)

## 2015-11-02 MED ORDER — IOPAMIDOL (ISOVUE-300) INJECTION 61%
150.0000 mL | Freq: Once | INTRAVENOUS | Status: AC | PRN
  Administered 2015-11-02: 125 mL via INTRAVENOUS
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
## 2015-11-05 ENCOUNTER — Inpatient Hospital Stay (HOSPITAL_BASED_OUTPATIENT_CLINIC_OR_DEPARTMENT_OTHER): Payer: 59 | Admitting: Oncology

## 2015-11-05 VITALS — BP 131/86 | HR 64 | Temp 97.4°F | Resp 18 | Wt 237.1 lb

## 2015-11-05 DIAGNOSIS — C8303 Small cell B-cell lymphoma, intra-abdominal lymph nodes: Secondary | ICD-10-CM

## 2015-11-05 DIAGNOSIS — Z79899 Other long term (current) drug therapy: Secondary | ICD-10-CM

## 2015-11-05 LAB — COMP PANEL: LEUKEMIA/LYMPHOMA

## 2015-11-05 NOTE — Progress Notes (Signed)
States is feeling well. Offers no complaints. 

## 2015-11-07 ENCOUNTER — Other Ambulatory Visit: Payer: Self-pay | Admitting: Family Medicine

## 2015-11-18 ENCOUNTER — Other Ambulatory Visit: Payer: Self-pay | Admitting: Family Medicine

## 2015-11-18 NOTE — Telephone Encounter (Signed)
routing to provider

## 2015-11-28 ENCOUNTER — Other Ambulatory Visit: Payer: Self-pay | Admitting: Family Medicine

## 2016-02-21 ENCOUNTER — Other Ambulatory Visit: Payer: Self-pay | Admitting: Family Medicine

## 2016-03-06 ENCOUNTER — Other Ambulatory Visit: Payer: Self-pay | Admitting: Family Medicine

## 2016-03-06 NOTE — Telephone Encounter (Signed)
Routing to provider  

## 2016-03-21 ENCOUNTER — Encounter: Payer: Self-pay | Admitting: *Deleted

## 2016-03-21 ENCOUNTER — Encounter
Admission: RE | Disposition: A | Discharge status: Home / Self Care | Payer: Self-pay | Source: Ambulatory Visit | Attending: Internal Medicine

## 2016-03-21 ENCOUNTER — Ambulatory Visit
Admission: RE | Admit: 2016-03-21 | Discharge: 2016-03-21 | Disposition: A | Discharge status: Home / Self Care | Payer: 59 | Source: Ambulatory Visit | Attending: Internal Medicine | Admitting: Internal Medicine

## 2016-03-21 DIAGNOSIS — Z7982 Long term (current) use of aspirin: Secondary | ICD-10-CM | POA: Insufficient documentation

## 2016-03-21 DIAGNOSIS — R0789 Other chest pain: Secondary | ICD-10-CM | POA: Diagnosis not present

## 2016-03-21 DIAGNOSIS — I2510 Atherosclerotic heart disease of native coronary artery without angina pectoris: Secondary | ICD-10-CM | POA: Diagnosis not present

## 2016-03-21 DIAGNOSIS — E785 Hyperlipidemia, unspecified: Secondary | ICD-10-CM | POA: Insufficient documentation

## 2016-03-21 DIAGNOSIS — Z8572 Personal history of non-Hodgkin lymphomas: Secondary | ICD-10-CM | POA: Insufficient documentation

## 2016-03-21 DIAGNOSIS — Z791 Long term (current) use of non-steroidal anti-inflammatories (NSAID): Secondary | ICD-10-CM | POA: Diagnosis not present

## 2016-03-21 DIAGNOSIS — Z6834 Body mass index (BMI) 34.0-34.9, adult: Secondary | ICD-10-CM | POA: Diagnosis not present

## 2016-03-21 DIAGNOSIS — Z79899 Other long term (current) drug therapy: Secondary | ICD-10-CM | POA: Diagnosis not present

## 2016-03-21 DIAGNOSIS — E668 Other obesity: Secondary | ICD-10-CM | POA: Insufficient documentation

## 2016-03-21 DIAGNOSIS — Z8249 Family history of ischemic heart disease and other diseases of the circulatory system: Secondary | ICD-10-CM | POA: Insufficient documentation

## 2016-03-21 DIAGNOSIS — Z87891 Personal history of nicotine dependence: Secondary | ICD-10-CM | POA: Insufficient documentation

## 2016-03-21 DIAGNOSIS — I10 Essential (primary) hypertension: Secondary | ICD-10-CM | POA: Insufficient documentation

## 2016-03-21 DIAGNOSIS — I200 Unstable angina: Secondary | ICD-10-CM | POA: Diagnosis present

## 2016-03-21 HISTORY — DX: Peripheral vascular disease, unspecified: I73.9

## 2016-03-21 HISTORY — PX: LEFT HEART CATH AND CORONARY ANGIOGRAPHY: CATH118249

## 2016-03-21 HISTORY — DX: Hyperlipidemia, unspecified: E78.5

## 2016-03-21 LAB — FIBRIN DERIVATIVES D-DIMER (ARMC ONLY): Fibrin derivatives D-dimer (ARMC): 168 (ref 0–499)

## 2016-03-21 LAB — CARDIAC CATHETERIZATION: Cath EF Quantitative: 60 %

## 2016-03-21 SURGERY — LEFT HEART CATH AND CORONARY ANGIOGRAPHY
Anesthesia: Moderate Sedation | Laterality: Left

## 2016-03-21 MED ORDER — MIDAZOLAM HCL 2 MG/2ML IJ SOLN
INTRAMUSCULAR | Status: DC | PRN
  Administered 2016-03-21: 1 mg via INTRAVENOUS

## 2016-03-21 MED ORDER — MIDAZOLAM HCL 2 MG/2ML IJ SOLN
INTRAMUSCULAR | Status: AC
  Filled 2016-03-21: qty 2

## 2016-03-21 MED ORDER — HEPARIN (PORCINE) IN NACL 2-0.9 UNIT/ML-% IJ SOLN
INTRAMUSCULAR | Status: AC
  Filled 2016-03-21: qty 500

## 2016-03-21 MED ORDER — FENTANYL CITRATE (PF) 100 MCG/2ML IJ SOLN
INTRAMUSCULAR | Status: AC
  Filled 2016-03-21: qty 2

## 2016-03-21 MED ORDER — IOPAMIDOL (ISOVUE-300) INJECTION 61%
INTRAVENOUS | Status: DC | PRN
  Administered 2016-03-21: 110 mL via INTRA_ARTERIAL

## 2016-03-21 MED ORDER — FENTANYL CITRATE (PF) 100 MCG/2ML IJ SOLN
INTRAMUSCULAR | Status: DC | PRN
  Administered 2016-03-21: 25 ug via INTRAVENOUS

## 2016-03-21 SURGICAL SUPPLY — 10 items
CATH 5FR PIGTAIL DIAGNOSTIC (CATHETERS) ×2 IMPLANT
CATH INFINITI 5 FR MPA2 (CATHETERS) ×2 IMPLANT
CATH INFINITI 5FR JL4 (CATHETERS) ×2 IMPLANT
CATH INFINITI JR4 5F (CATHETERS) ×2 IMPLANT
DEVICE CLOSURE MYNXGRIP 5F (Vascular Products) ×2 IMPLANT
KIT MANI 3VAL PERCEP (MISCELLANEOUS) ×2 IMPLANT
NEEDLE PERC 18GX7CM (NEEDLE) ×2 IMPLANT
PACK CARDIAC CATH (CUSTOM PROCEDURE TRAY) ×2 IMPLANT
SHEATH AVANTI 5FR X 11CM (SHEATH) ×2 IMPLANT
WIRE EMERALD 3MM-J .035X150CM (WIRE) ×2 IMPLANT

## 2016-03-21 NOTE — Discharge Instructions (Signed)

## 2016-03-23 ENCOUNTER — Encounter: Payer: Self-pay | Admitting: Family Medicine

## 2016-03-23 ENCOUNTER — Ambulatory Visit (INDEPENDENT_AMBULATORY_CARE_PROVIDER_SITE_OTHER): Payer: 59 | Admitting: Family Medicine

## 2016-03-23 VITALS — BP 131/80 | HR 69 | Temp 98.9°F | Resp 17 | Ht 69.0 in | Wt 235.0 lb

## 2016-03-23 DIAGNOSIS — E782 Mixed hyperlipidemia: Secondary | ICD-10-CM

## 2016-03-23 MED ORDER — ROSUVASTATIN CALCIUM 20 MG PO TABS: 20.0000 mg | ORAL_TABLET | Freq: Every day | ORAL | 1 refills | Status: DC
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
## 2016-03-27 NOTE — Patient Instructions (Signed)
Follow up in 6 months 

## 2016-03-27 NOTE — Assessment & Plan Note (Signed)
Will do trial of Crestor 20 mg. Discussed lifestyle modifications. Follow up in 6 months for fasting lipid panel unless having side effects from the medication sooner.

## 2016-05-15 ENCOUNTER — Encounter: Payer: Self-pay | Admitting: Family Medicine

## 2016-05-15 ENCOUNTER — Encounter: Payer: Self-pay | Admitting: Oncology

## 2016-06-04 ENCOUNTER — Other Ambulatory Visit: Payer: Self-pay | Admitting: Family Medicine

## 2016-07-06 ENCOUNTER — Other Ambulatory Visit: Payer: Self-pay | Admitting: Family Medicine

## 2016-09-21 ENCOUNTER — Encounter: Payer: Self-pay | Admitting: Family Medicine

## 2016-09-22 ENCOUNTER — Other Ambulatory Visit: Payer: Self-pay | Admitting: Family Medicine

## 2016-09-25 ENCOUNTER — Ambulatory Visit (INDEPENDENT_AMBULATORY_CARE_PROVIDER_SITE_OTHER): Payer: 59 | Admitting: Family Medicine

## 2016-09-25 ENCOUNTER — Encounter: Payer: Self-pay | Admitting: Family Medicine

## 2016-09-25 VITALS — BP 117/71 | HR 61 | Temp 99.0°F | Ht 68.7 in | Wt 221.4 lb

## 2016-09-25 DIAGNOSIS — C8303 Small cell B-cell lymphoma, intra-abdominal lymph nodes: Secondary | ICD-10-CM

## 2016-09-25 DIAGNOSIS — W57XXXA Bitten or stung by nonvenomous insect and other nonvenomous arthropods, initial encounter: Secondary | ICD-10-CM

## 2016-09-25 DIAGNOSIS — I10 Essential (primary) hypertension: Secondary | ICD-10-CM | POA: Diagnosis not present

## 2016-09-25 DIAGNOSIS — Z0000 Encounter for general adult medical examination without abnormal findings: Secondary | ICD-10-CM | POA: Diagnosis not present

## 2016-09-25 DIAGNOSIS — E782 Mixed hyperlipidemia: Secondary | ICD-10-CM | POA: Diagnosis not present

## 2016-09-25 DIAGNOSIS — R7989 Other specified abnormal findings of blood chemistry: Secondary | ICD-10-CM | POA: Diagnosis not present

## 2016-09-25 DIAGNOSIS — Z1211 Encounter for screening for malignant neoplasm of colon: Secondary | ICD-10-CM

## 2016-09-25 LAB — UA/M W/RFLX CULTURE, ROUTINE
Bilirubin, UA: NEGATIVE
GLUCOSE, UA: NEGATIVE
Ketones, UA: NEGATIVE
Leukocytes, UA: NEGATIVE
Nitrite, UA: NEGATIVE
PROTEIN UA: NEGATIVE
Specific Gravity, UA: 1.02 (ref 1.005–1.030)
Urobilinogen, Ur: 0.2 mg/dL (ref 0.2–1.0)
pH, UA: 5.5 (ref 5.0–7.5)

## 2016-09-25 LAB — MICROSCOPIC EXAMINATION
Bacteria, UA: NONE SEEN
RBC MICROSCOPIC, UA: NONE SEEN /HPF (ref 0–?)

## 2016-09-25 LAB — MICROALBUMIN, URINE WAIVED
CREATININE, URINE WAIVED: 200 mg/dL (ref 10–300)
MICROALB, UR WAIVED: 10 mg/L (ref 0–19)

## 2016-09-25 MED ORDER — BENAZEPRIL HCL 20 MG PO TABS: ORAL_TABLET | ORAL | 1 refills | Status: DC

## 2016-09-25 MED ORDER — HYDROCHLOROTHIAZIDE 25 MG PO TABS: 25.0000 mg | ORAL_TABLET | Freq: Every day | ORAL | 1 refills | Status: DC

## 2016-09-25 MED ORDER — TRIAMCINOLONE ACETONIDE 0.5 % EX OINT: 1.0000 "application " | TOPICAL_OINTMENT | Freq: Two times a day (BID) | CUTANEOUS | 0 refills | Status: DC

## 2016-09-25 MED ORDER — ROSUVASTATIN CALCIUM 20 MG PO TABS: 20.0000 mg | ORAL_TABLET | Freq: Every day | ORAL | 1 refills | Status: DC

## 2016-09-25 NOTE — Assessment & Plan Note (Signed)
Under good control. Continue current regimen. Continue to monitor. Call with any concerns. 

## 2016-09-25 NOTE — Assessment & Plan Note (Signed)
Stable. Continue to follow with oncology. Call with any concerns. CBC checked today.

## 2016-09-25 NOTE — Assessment & Plan Note (Signed)
Rechecking levels today. Continue to follow with Dr. Boneta Lucks. Will forward results tomorrow.

## 2016-09-25 NOTE — Progress Notes (Signed)
BP 117/71 (BP Location: Left Arm, Patient Position: Sitting, Cuff Size: Large)   Pulse 61   Temp 99 F (37.2 C)   Ht 5' 8.7" (1.745 m)   Wt 221 lb 7 oz (100.4 kg)   SpO2 94%   BMI 32.99 kg/m    Subjective:    Patient ID: Jared Cowan, male    DOB: Jan 17, 1956, 61 y.o.   MRN: [id number]  HPI: Jared Cowan is a 61 y.o. male presenting on 09/25/2016 for comprehensive medical examination. Current medical complaints include:  HYPERTENSION / HYPERLIPIDEMIA Satisfied with current treatment? yes Duration of hypertension: chronic BP monitoring frequency: not checking BP medication side effects: no Past BP meds: benazepril, HCTZ Duration of hyperlipidemia: chronic Cholesterol medication side effects: no Cholesterol supplements: fish oil Past cholesterol medications: crestor Medication compliance: excellent compliance Aspirin: no Recent stressors: yes Recurrent headaches: no Visual changes: no Palpitations: no Dyspnea: no Chest pain: no Lower extremity edema: no Dizzy/lightheaded: no  LOW TESTOSTERONE- follows with Urology, due for labs today, feels like he might be low Duration: chronic Status: stable  Satisfied with current treatment:  yes Previous testosterone therapies: gel  Medication side effects:  no Medication compliance: excellent compliance Decreased libido: yes Fatigue: yes Depressed mood: no Muscle weakness: no Erectile dysfunction: yes  He currently lives with: wife Interim Problems from his last visit: no  Depression Screen done today and results listed below:  Depression screen [facility] 2/9 09/25/2016  Decreased Interest 0  Down, Depressed, Hopeless 0  PHQ - 2 Score 0    Past Medical History:  Past Medical History:  Diagnosis Date  . Cancer ([place]) 03/2014   Small Lymphotic Lymphoma  . Fracture    left arm  . Hyperlipidemia   . Hypertension   . Low testosterone   . Peripheral vascular disease ([place])   . Precancerous skin lesion    Eye  .  Sleep apnea     Surgical History:  Past Surgical History:  Procedure Laterality Date  . APPENDECTOMY    . [place]  . LASER OF PROSTATE W/ GREEN LIGHT PVP  2014  . LEFT HEART CATH AND CORONARY ANGIOGRAPHY Left 03/21/2016   Procedure: Left Heart Cath and Coronary Angiography;  Surgeon: Yolonda Kida, MD;  Location: [facility];  Service: Cardiovascular;  Laterality: Left;  . LYMPH NODE BIOPSY Left 03/2014   cancerous  . PROSTATE SURGERY    . SKIN BIOPSY     Eyelid  . TONSILLECTOMY    . urethra and bladder unblocked  2012    Medications:  Current Outpatient Prescriptions on File Prior to Visit  Medication Sig  . Cholecalciferol (VITAMIN D) 2000 units CAPS Take 2,000 Units by mouth daily.  Marland Kitchen CIALIS 20 MG tablet Take 20 mg by mouth daily as needed for erectile dysfunction.   . Ibuprofen-Famotidine 800-26.6 MG TABS Take 1 tablet by mouth daily.  . Multiple Vitamin (MULTIVITAMIN WITH MINERALS) TABS tablet Take 1 tablet by mouth daily.  . Omega-3 Fatty Acids (EQL OMEGA 3 FISH OIL) 1200 MG CPDR Take 1 capsule by mouth daily. Also includes 360mg  of Omega 3  . Testosterone (TESTOPEL IL) by Implant route.   No current facility-administered medications on file prior to visit.     Allergies:  Allergies  Allergen Reactions  . Adhesive [Tape] Rash    Bandaids after long term use    Social History:  Social History   Social History  . Marital status:  Married    Spouse name: N/A  . Number of children: N/A  . Years of education: N/A   Occupational History  . Not on file.   Social History Main Topics  . Smoking status: Former Smoker    Quit date: 01/31/1996  . Smokeless tobacco: Never Used  . Alcohol use Yes     Comment: on occasion  . Drug use: No  . Sexual activity: Not Currently   Other Topics Concern  . Not on file   Social History Narrative  . No narrative on file   History  Smoking Status  . Former Smoker  . Quit date: 01/31/1996    Smokeless Tobacco  . Never Used   History  Alcohol Use  . Yes    Comment: on occasion    Family History:  Family History  Problem Relation Age of Onset  . Dementia Mother 78  . Hypertension Mother   . Hypertension Father   . Heart attack Father   . Heart disease Father   . Cancer Sister        breast  . Cancer Sister        breast  . Early death Paternal Grandfather   . Heart disease Paternal Grandfather     Past medical history, surgical history, medications, allergies, family history and social history reviewed with patient today and changes made to appropriate areas of the chart.   Review of Systems  Constitutional: Negative.   HENT: Positive for hearing loss. Negative for congestion, ear discharge, ear pain, nosebleeds, sinus pain, sore throat and tinnitus.   Eyes: Negative.   Respiratory: Negative.  Negative for stridor.   Cardiovascular: Negative.   Gastrointestinal: Negative.   Genitourinary: Positive for frequency. Negative for dysuria, flank pain, hematuria and urgency.       + hesitancy  Musculoskeletal: Positive for joint pain and myalgias. Negative for back pain, falls and neck pain.  Skin: Negative.        Bug bite on L arm   Neurological: Negative.   Endo/Heme/Allergies: Negative.   Psychiatric/Behavioral: Negative.     All other ROS negative except what is listed above and in the HPI.      Objective:    BP 117/71 (BP Location: Left Arm, Patient Position: Sitting, Cuff Size: Large)   Pulse 61   Temp 99 F (37.2 C)   Ht 5' 8.7" (1.745 m)   Wt 221 lb 7 oz (100.4 kg)   SpO2 94%   BMI 32.99 kg/m   Wt Readings from Last 3 Encounters:  09/25/16 221 lb 7 oz (100.4 kg)  03/23/16 235 lb (106.6 kg)  03/21/16 234 lb (106.1 kg)    Physical Exam  Constitutional: He is oriented to person, place, and time. He appears well-developed and well-nourished. No distress.  HENT:  Head: Normocephalic and atraumatic.  Right Ear: Hearing, tympanic membrane,  external ear and ear canal normal.  Left Ear: Hearing, tympanic membrane, external ear and ear canal normal.  Nose: Nose normal.  Mouth/Throat: Uvula is midline, oropharynx is clear and moist and mucous membranes are normal. No oropharyngeal exudate.  Eyes: Pupils are equal, round, and reactive to light. Conjunctivae, EOM and lids are normal. Right eye exhibits no discharge. Left eye exhibits no discharge. No scleral icterus.  Neck: Normal range of motion. Neck supple. No JVD present. No tracheal deviation present. No thyromegaly present.  Cardiovascular: Normal rate, regular rhythm, normal heart sounds and intact distal pulses.  Exam reveals no gallop and  no friction rub.   No murmur heard. Pulmonary/Chest: Effort normal and breath sounds normal. No stridor. No respiratory distress. He has no wheezes. He has no rales. He exhibits no tenderness.  Abdominal: Soft. Bowel sounds are normal. He exhibits no distension and no mass. There is no tenderness. There is no rebound and no guarding.  Genitourinary:  Genitourinary Comments: Genital exam deferred- done at urology.  Musculoskeletal: Normal range of motion. He exhibits no edema, tenderness or deformity.  Lymphadenopathy:    He has no cervical adenopathy.  Neurological: He is alert and oriented to person, place, and time. He has normal reflexes. He displays normal reflexes. No cranial nerve deficit. He exhibits normal muscle tone. Coordination normal.  Skin: Skin is warm, dry and intact. No rash noted. He is not diaphoretic. No erythema. No pallor.  Psychiatric: He has a normal mood and affect. His speech is normal and behavior is normal. Judgment and thought content normal. Cognition and memory are normal.  Nursing note and vitals reviewed.   Results for orders placed or performed during the hospital encounter of 03/21/16  Fibrin derivatives D-Dimer ([facility] only)  Result Value Ref Range   Fibrin derivatives D-dimer ([facility]) 168 0 - 499  Cardiac  catheterization  Result Value Ref Range   Cath EF Quantitative 60 %      Assessment & Plan:   Problem List Items Addressed This Visit      Cardiovascular and Mediastinum   Essential hypertension    Under good control. Continue current regimen. Continue to monitor. Call with any concerns.       Relevant Medications   benazepril (LOTENSIN) 20 MG tablet   hydrochlorothiazide (HYDRODIURIL) 25 MG tablet   rosuvastatin (CRESTOR) 20 MG tablet   Other Relevant Orders   Comprehensive metabolic panel   Microalbumin, Urine Waived   TSH   UA/M w/rflx Culture, Routine     Other   Small cell B-cell lymphoma (HCC)    Stable. Continue to follow with oncology. Call with any concerns. CBC checked today.       Relevant Orders   CBC with Differential/Platelet   Comprehensive metabolic panel   UA/M w/rflx Culture, Routine   Hyperlipidemia    Under good control. Continue current regimen. Continue to monitor. Call with any concerns.       Relevant Medications   benazepril (LOTENSIN) 20 MG tablet   hydrochlorothiazide (HYDRODIURIL) 25 MG tablet   rosuvastatin (CRESTOR) 20 MG tablet   Other Relevant Orders   Lipid Panel w/o Chol/HDL Ratio   UA/M w/rflx Culture, Routine   Low testosterone    Rechecking levels today. Continue to follow with Dr. Boneta Lucks. Will forward results tomorrow.       Relevant Orders   PSA   UA/M w/rflx Culture, Routine   Testosterone, free, total    Other Visit Diagnoses    Routine general medical examination at a health care facility    -  Primary   Vaccines up to date. Screening labs checked today. Colonoscopy up to date. Continue diet and exercise. Call with any concerns.    Relevant Orders   CBC with Differential/Platelet   Comprehensive metabolic panel   Lipid Panel w/o Chol/HDL Ratio   Microalbumin, Urine Waived   PSA   TSH   UA/M w/rflx Culture, Routine   Testosterone, free, total   Screening for colon cancer       Due in 2020- but would like to go  earlier with the lymphoma Dx. Referral put  in today.   Relevant Orders   Ambulatory referral to Gastroenterology   Bug bite, initial encounter       Triamcinalone ointment sent to his pharmacy.       Discussed aspirin prophylaxis for myocardial infarction prevention and decision was it was not indicated  LABORATORY TESTING:  Health maintenance labs ordered today as discussed above.   The natural history of prostate cancer and ongoing controversy regarding screening and potential treatment outcomes of prostate cancer has been discussed with the patient. The meaning of a false positive PSA and a false negative PSA has been discussed. He indicates understanding of the limitations of this screening test and wishes to proceed with screening PSA testing.   IMMUNIZATIONS:   - Tdap: Tetanus vaccination status reviewed: last tetanus booster within 10 years. - Influenza: Postponed to flu season - Pneumovax: Not applicable  SCREENING: - Colonoscopy: Up to date  Discussed with patient purpose of the colonoscopy is to detect colon cancer at curable precancerous or early stages   PATIENT COUNSELING:    Sexuality: Discussed sexually transmitted diseases, partner selection, use of condoms, avoidance of unintended pregnancy  and contraceptive alternatives.   Advised to avoid cigarette smoking.  I discussed with the patient that most people either abstain from alcohol or drink within safe limits (<=14/week and <=4 drinks/occasion for males, <=7/weeks and <= 3 drinks/occasion for females) and that the risk for alcohol disorders and other health effects rises proportionally with the number of drinks per week and how often a drinker exceeds daily limits.  Discussed cessation/primary prevention of drug use and availability of treatment for abuse.   Diet: Encouraged to adjust caloric intake to maintain  or achieve ideal body weight, to reduce intake of dietary saturated fat and total fat, to limit sodium  intake by avoiding high sodium foods and not adding table salt, and to maintain adequate dietary potassium and calcium preferably from fresh fruits, vegetables, and low-fat dairy products.    stressed the importance of regular exercise  Injury prevention: Discussed safety belts, safety helmets, smoke detector, smoking near bedding or upholstery.   Dental health: Discussed importance of regular tooth brushing, flossing, and dental visits.   Follow up plan: NEXT PREVENTATIVE PHYSICAL DUE IN 1 YEAR. No Follow-up on file.

## 2016-09-25 NOTE — Patient Instructions (Addendum)
Health Maintenance, Male A healthy lifestyle and preventive care is important for your health and wellness. Ask your health care provider about what schedule of regular examinations is right for you. What should I know about weight and diet? Eat a Healthy Diet  Eat plenty of vegetables, fruits, whole grains, low-fat dairy products, and lean protein.  Do not eat a lot of foods high in solid fats, added sugars, or salt.  Maintain a Healthy Weight Regular exercise can help you achieve or maintain a healthy weight. You should:  Do at least 150 minutes of exercise each week. The exercise should increase your heart rate and make you sweat (moderate-intensity exercise).  Do strength-training exercises at least twice a week.  Watch Your Levels of Cholesterol and Blood Lipids  Have your blood tested for lipids and cholesterol every 5 years starting at 62 years of age. If you are at high risk for heart disease, you should start having your blood tested when you are 62 years old. You may need to have your cholesterol levels checked more often if: ? Your lipid or cholesterol levels are high. ? You are older than 62 years of age. ? You are at high risk for heart disease.  What should I know about cancer screening? Many types of cancers can be detected early and may often be prevented. Lung Cancer  You should be screened every year for lung cancer if: ? You are a current smoker who has smoked for at least 30 years. ? You are a former smoker who has quit within the past 15 years.  Talk to your health care provider about your screening options, when you should start screening, and how often you should be screened.  Colorectal Cancer  Routine colorectal cancer screening usually begins at 62 years of age and should be repeated every 5-10 years until you are 62 years old. You may need to be screened more often if early forms of precancerous polyps or small growths are found. Your health care provider  may recommend screening at an earlier age if you have risk factors for colon cancer.  Your health care provider may recommend using home test kits to check for hidden blood in the stool.  A small camera at the end of a tube can be used to examine your colon (sigmoidoscopy or colonoscopy). This checks for the earliest forms of colorectal cancer.  Prostate and Testicular Cancer  Depending on your age and overall health, your health care provider may do certain tests to screen for prostate and testicular cancer.  Talk to your health care provider about any symptoms or concerns you have about testicular or prostate cancer.  Skin Cancer  Check your skin from head to toe regularly.  Tell your health care provider about any new moles or changes in moles, especially if: ? There is a change in a mole's size, shape, or color. ? You have a mole that is larger than a pencil eraser.  Always use sunscreen. Apply sunscreen liberally and repeat throughout the day.  Protect yourself by wearing long sleeves, pants, a wide-brimmed hat, and sunglasses when outside.  What should I know about heart disease, diabetes, and high blood pressure?  If you are 18-39 years of age, have your blood pressure checked every 3-5 years. If you are 40 years of age or older, have your blood pressure checked every year. You should have your blood pressure measured twice-once when you are at a hospital or clinic, and once when   you are not at a hospital or clinic. Record the average of the two measurements. To check your blood pressure when you are not at a hospital or clinic, you can use: ? An automated blood pressure machine at a pharmacy. ? A home blood pressure monitor.  Talk to your health care provider about your target blood pressure.  If you are between 32-46 years old, ask your health care provider if you should take aspirin to prevent heart disease.  Have regular diabetes screenings by checking your fasting blood  sugar level. ? If you are at a normal weight and have a low risk for diabetes, have this test once every three years after the age of 44. ? If you are overweight and have a high risk for diabetes, consider being tested at a younger age or more often.  A one-time screening for abdominal aortic aneurysm (AAA) by ultrasound is recommended for men aged 30-75 years who are current or former smokers. What should I know about preventing infection? Hepatitis B If you have a higher risk for hepatitis B, you should be screened for this virus. Talk with your health care provider to find out if you are at risk for hepatitis B infection. Hepatitis C Blood testing is recommended for:  Everyone born from [birth year] through 1966.  Anyone with known risk factors for hepatitis C.  Sexually Transmitted Diseases (STDs)  You should be screened each year for STDs including gonorrhea and chlamydia if: ? You are sexually active and are younger than 62 years of age. ? You are older than 62 years of age and your health care provider tells you that you are at risk for this type of infection. ? Your sexual activity has changed since you were last screened and you are at an increased risk for chlamydia or gonorrhea. Ask your health care provider if you are at risk.  Talk with your health care provider about whether you are at high risk of being infected with HIV. Your health care provider may recommend a prescription medicine to help prevent HIV infection.  What else can I do?  Schedule regular health, dental, and eye exams.  Stay current with your vaccines (immunizations).  Do not use any tobacco products, such as cigarettes, chewing tobacco, and e-cigarettes. If you need help quitting, ask your health care provider.  Limit alcohol intake to no more than 2 drinks per day. One drink equals 12 ounces of beer, 5 ounces of wine, or 1 ounces of hard liquor.  Do not use street drugs.  Do not share needles.  Ask your  health care provider for help if you need support or information about quitting drugs.  Tell your health care provider if you often feel depressed.  Tell your health care provider if you have ever been abused or do not feel safe at home. This information is not intended to replace advice given to you by your health care provider. Make sure you discuss any questions you have with your health care provider. Document Released: 07/15/2007 Document Revised: 09/15/2015 Document Reviewed: 10/20/2014 Elsevier Interactive Patient Education  2018 [employer].  Shoulder Range of Motion Exercises Shoulder range of motion (ROM) exercises are designed to keep the shoulder moving freely. They are often recommended for people who have shoulder pain. Phase 1 exercises When you are able, do this exercise 5-6 days per week, or as told by your health care provider. Work toward doing 2 sets of 10 swings. Pendulum Exercise How To Do  This Exercise Lying Down 1. Lie face-down on a bed with your abdomen close to the side of the bed. 2. Let your arm hang over the side of the bed. 3. Relax your shoulder, arm, and hand. 4. Slowly and gently swing your arm forward and back. Do not use your neck muscles to swing your arm. They should be relaxed. If you are struggling to swing your arm, have someone gently swing it for you. When you do this exercise for the first time, swing your arm at a 15 degree angle for 15 seconds, or swing your arm 10 times. As pain lessens over time, increase the angle of the swing to 30-45 degrees. 5. Repeat steps 1-4 with the other arm.  How To Do This Exercise While Standing 1. Stand next to a sturdy chair or table and hold on to it with your hand. 1. Bend forward at the waist. 2. Bend your knees slightly. 3. Relax your other arm and let it hang limp. 4. Relax the shoulder blade of the arm that is hanging and let it drop. 5. While keeping your shoulder relaxed, use body motion to swing your  arm in small circles. The first time you do this exercise, swing your arm for about 30 seconds or 10 times. When you do it next time, swing your arm for a little longer. 6. Stand up tall and relax. 7. Repeat steps 1-7, this time changing the direction of the circles. 2. Repeat steps 1-8 with the other arm.  Phase 2 exercises Do these exercises 3-4 times per day on 5-6 days per week or as told by your health care provider. Work toward holding the stretch for 20 seconds. Stretching Exercise 1 1. Lift your arm straight out in front of you. 2. Bend your arm 90 degrees at the elbow (right angle) so your forearm goes across your body and looks like the letter "L." 3. Use your other arm to gently pull the elbow forward and across your body. 4. Repeat steps 1-3 with the other arm. Stretching Exercise 2 You will need a towel or rope for this exercise. 1. Bend one arm behind your back with the palm facing outward. 2. Hold a towel with your other hand. 3. Reach the arm that holds the towel above your head, and bend that arm at the elbow. Your wrist should be behind your neck. 4. Use your free hand to grab the free end of the towel. 5. With the higher hand, gently pull the towel up behind you. 6. With the lower hand, pull the towel down behind you. 7. Repeat steps 1-6 with the other arm.  Phase 3 exercises Do each of these exercises at four different times of day (sessions) every day or as told by your health care provider. To begin with, repeat each exercise 5 times (repetitions). Work toward doing 3 sets of 12 repetitions or as told by your health care provider. Strengthening Exercise 1 You will need a light weight for this activity. As you grow stronger, you may use a heavier weight. 1. Standing with a weight in your hand, lift your arm straight out to the side until it is at the same height as your shoulder. 2. Bend your arm at 90 degrees so that your fingers are pointing to the  ceiling. 3. Slowly raise your hand until your arm is straight up in the air. 4. Repeat steps 1-3 with the other arm.  Strengthening Exercise 2 You will need a light  weight for this activity. As you grow stronger, you may use a heavier weight. 1. Standing with a weight in your hand, gradually move your straight arm in an arc, starting at your side, then out in front of you, then straight up over your head. 2. Gradually move your other arm in an arc, starting at your side, then out in front of you, then straight up over your head. 3. Repeat steps 1-2 with the other arm.  Strengthening Exercise 3 You will need an elastic band for this activity. As you grow stronger, gradually increase the size of the bands or increase the number of bands that you use at one time. 1. While standing, hold an elastic band in one hand and raise that arm up in the air. 2. With your other hand, pull down the band until that hand is by your side. 3. Repeat steps 1-2 with the other arm.  This information is not intended to replace advice given to you by your health care provider. Make sure you discuss any questions you have with your health care provider. Document Released: 10/15/2002 Document Revised: 09/12/2015 Document Reviewed: 01/12/2014 Elsevier Interactive Patient Education  [employer].

## 2016-09-26 LAB — CBC WITH DIFFERENTIAL/PLATELET
BASOS ABS: 0 10*3/uL (ref 0.0–0.2)
BASOS: 0 %
EOS (ABSOLUTE): 0.1 10*3/uL (ref 0.0–0.4)
Eos: 2 %
HEMOGLOBIN: 16.2 g/dL (ref 13.0–17.7)
Hematocrit: 46.3 % (ref 37.5–51.0)
IMMATURE GRANS (ABS): 0 10*3/uL (ref 0.0–0.1)
IMMATURE GRANULOCYTES: 0 %
LYMPHS: 24 %
Lymphocytes Absolute: 1.4 10*3/uL (ref 0.7–3.1)
MCH: 30.3 pg (ref 26.6–33.0)
MCHC: 35 g/dL (ref 31.5–35.7)
MCV: 87 fL (ref 79–97)
Monocytes Absolute: 0.6 10*3/uL (ref 0.1–0.9)
Monocytes: 10 %
NEUTROS ABS: 3.7 10*3/uL (ref 1.4–7.0)
Neutrophils: 64 %
Platelets: 155 10*3/uL (ref 150–379)
RBC: 5.35 x10E6/uL (ref 4.14–5.80)
RDW: 11.9 % — ABNORMAL LOW (ref 12.3–15.4)
WBC: 5.8 10*3/uL (ref 3.4–10.8)

## 2016-09-26 LAB — COMPREHENSIVE METABOLIC PANEL
ALBUMIN: 4.4 g/dL (ref 3.6–4.8)
ALT: 44 IU/L (ref 0–44)
AST: 30 IU/L (ref 0–40)
Albumin/Globulin Ratio: 1.8 (ref 1.2–2.2)
Alkaline Phosphatase: 59 IU/L (ref 39–117)
BILIRUBIN TOTAL: 0.7 mg/dL (ref 0.0–1.2)
BUN / CREAT RATIO: 21 (ref 10–24)
BUN: 21 mg/dL (ref 8–27)
CALCIUM: 9.7 mg/dL (ref 8.6–10.2)
CHLORIDE: 104 mmol/L (ref 96–106)
CO2: 23 mmol/L (ref 20–29)
CREATININE: 1 mg/dL (ref 0.76–1.27)
GFR, EST AFRICAN AMERICAN: 93 mL/min/{1.73_m2} (ref >59)
GFR, EST NON AFRICAN AMERICAN: 80 mL/min/{1.73_m2} (ref >59)
Globulin, Total: 2.5 g/dL (ref 1.5–4.5)
Glucose: 103 mg/dL — ABNORMAL HIGH (ref 65–99)
Potassium: 4.1 mmol/L (ref 3.5–5.2)
Sodium: 140 mmol/L (ref 134–144)
TOTAL PROTEIN: 6.9 g/dL (ref 6.0–8.5)

## 2016-09-26 LAB — TESTOSTERONE, FREE, TOTAL, SHBG
Sex Hormone Binding: 28.4 nmol/L (ref 19.3–76.4)
TESTOSTERONE: 197 ng/dL — AB (ref 264–916)
Testosterone, Free: 7.3 pg/mL (ref 6.6–18.1)

## 2016-09-26 LAB — LIPID PANEL W/O CHOL/HDL RATIO
Cholesterol, Total: 157 mg/dL (ref 100–199)
HDL: 41 mg/dL (ref >39)
LDL CALC: 89 mg/dL (ref 0–99)
Triglycerides: 135 mg/dL (ref 0–149)
VLDL CHOLESTEROL CAL: 27 mg/dL (ref 5–40)

## 2016-09-26 LAB — TSH: TSH: 2.65 u[IU]/mL (ref 0.450–4.500)

## 2016-09-26 LAB — PSA: PROSTATE SPECIFIC AG, SERUM: 1 ng/mL (ref 0.0–4.0)

## 2016-10-12 ENCOUNTER — Other Ambulatory Visit: Payer: Self-pay

## 2016-10-12 ENCOUNTER — Telehealth: Payer: Self-pay

## 2016-10-12 DIAGNOSIS — Z1211 Encounter for screening for malignant neoplasm of colon: Secondary | ICD-10-CM

## 2016-10-12 NOTE — Telephone Encounter (Signed)
Gastroenterology Pre-Procedure Review  Request Date: 11/8 Requesting Physician: Dr. Marius Ditch  PATIENT REVIEW QUESTIONS: The patient responded to the following health history questions as indicated:    1. Are you having any GI issues? no 2. Do you have a personal history of Polyps? yes (10 years prior) 3. Do you have a family history of Colon Cancer or Polyps? yes (uncle: colon cancer) 4. Diabetes Mellitus? no 5. Joint replacements in the past 12 months?no 6. Major health problems in the past 3 months?yes (Lymphoma) 7. Any artificial heart valves, MVP, or defibrillator?yes (cardiac cath: all clear)    MEDICATIONS & ALLERGIES:    Patient reports the following regarding taking any anticoagulation/antiplatelet therapy:   Plavix, Coumadin, Eliquis, Xarelto, Lovenox, Pradaxa, Brilinta, or Effient? no Aspirin? no  Patient confirms/reports the following medications:  Current Outpatient Prescriptions  Medication Sig Dispense Refill  . benazepril (LOTENSIN) 20 MG tablet TAKE ONE & ONE-HALF TABLETS BY MOUTH ONCE DAILY 135 tablet 1  . Cholecalciferol (VITAMIN D) 2000 units CAPS Take 2,000 Units by mouth daily.    Marland Kitchen CIALIS 20 MG tablet Take 20 mg by mouth daily as needed for erectile dysfunction.     . hydrochlorothiazide (HYDRODIURIL) 25 MG tablet Take 1 tablet (25 mg total) by mouth daily. 90 tablet 1  . Ibuprofen-Famotidine 800-26.6 MG TABS Take 1 tablet by mouth daily. 90 tablet   . Multiple Vitamin (MULTIVITAMIN WITH MINERALS) TABS tablet Take 1 tablet by mouth daily.    . Omega-3 Fatty Acids (EQL OMEGA 3 FISH OIL) 1200 MG CPDR Take 1 capsule by mouth daily. Also includes 360mg  of Omega 3 60 capsule   . rosuvastatin (CRESTOR) 20 MG tablet Take 1 tablet (20 mg total) by mouth daily. 90 tablet 1  . Testosterone (TESTOPEL IL) by Implant route.    . triamcinolone ointment (KENALOG) 0.5 % Apply 1 application topically 2 (two) times daily. 30 g 0   No current facility-administered medications for  this visit.     Patient confirms/reports the following allergies:  Allergies  Allergen Reactions  . Adhesive [Tape] Rash    Bandaids after long term use    No orders of the defined types were placed in this encounter.   AUTHORIZATION INFORMATION Primary Insurance: 1D#: Group #:  Secondary Insurance: 1D#: Group #:  SCHEDULE INFORMATION: Date: 11/8 Time: Location: [place]

## 2016-10-31 ENCOUNTER — Other Ambulatory Visit: Payer: 59

## 2016-10-31 ENCOUNTER — Ambulatory Visit: Payer: 59

## 2016-10-31 ENCOUNTER — Inpatient Hospital Stay: Payer: 59 | Attending: Oncology

## 2016-10-31 ENCOUNTER — Ambulatory Visit
Admission: RE | Admit: 2016-10-31 | Discharge: 2016-10-31 | Disposition: A | Discharge status: Home / Self Care | Payer: 59 | Source: Ambulatory Visit | Attending: Oncology | Admitting: Oncology

## 2016-10-31 ENCOUNTER — Other Ambulatory Visit: Payer: Self-pay

## 2016-10-31 DIAGNOSIS — M2460 Ankylosis, unspecified joint: Secondary | ICD-10-CM | POA: Diagnosis not present

## 2016-10-31 DIAGNOSIS — C8303 Small cell B-cell lymphoma, intra-abdominal lymph nodes: Secondary | ICD-10-CM | POA: Diagnosis not present

## 2016-10-31 DIAGNOSIS — M5030 Other cervical disc degeneration, unspecified cervical region: Secondary | ICD-10-CM | POA: Insufficient documentation

## 2016-10-31 DIAGNOSIS — M2550 Pain in unspecified joint: Secondary | ICD-10-CM | POA: Diagnosis not present

## 2016-10-31 DIAGNOSIS — R918 Other nonspecific abnormal finding of lung field: Secondary | ICD-10-CM | POA: Diagnosis not present

## 2016-10-31 DIAGNOSIS — Z79899 Other long term (current) drug therapy: Secondary | ICD-10-CM | POA: Insufficient documentation

## 2016-10-31 DIAGNOSIS — I10 Essential (primary) hypertension: Secondary | ICD-10-CM | POA: Insufficient documentation

## 2016-10-31 DIAGNOSIS — D696 Thrombocytopenia, unspecified: Secondary | ICD-10-CM | POA: Insufficient documentation

## 2016-10-31 DIAGNOSIS — I739 Peripheral vascular disease, unspecified: Secondary | ICD-10-CM | POA: Diagnosis not present

## 2016-10-31 DIAGNOSIS — G4730 Sleep apnea, unspecified: Secondary | ICD-10-CM | POA: Diagnosis not present

## 2016-10-31 DIAGNOSIS — Z87891 Personal history of nicotine dependence: Secondary | ICD-10-CM | POA: Insufficient documentation

## 2016-10-31 DIAGNOSIS — E785 Hyperlipidemia, unspecified: Secondary | ICD-10-CM | POA: Insufficient documentation

## 2016-10-31 DIAGNOSIS — R599 Enlarged lymph nodes, unspecified: Secondary | ICD-10-CM | POA: Insufficient documentation

## 2016-10-31 DIAGNOSIS — Z7982 Long term (current) use of aspirin: Secondary | ICD-10-CM | POA: Diagnosis not present

## 2016-10-31 LAB — CBC WITH DIFFERENTIAL/PLATELET
Basophils Absolute: 0 10*3/uL (ref 0–0.1)
Basophils Relative: 1 %
Eosinophils Absolute: 0.1 10*3/uL (ref 0–0.7)
Eosinophils Relative: 2 %
HCT: 41.9 % (ref 40.0–52.0)
Hemoglobin: 14.3 g/dL (ref 13.0–18.0)
LYMPHS PCT: 24 %
Lymphs Abs: 1.2 10*3/uL (ref 1.0–3.6)
MCH: 30.3 pg (ref 26.0–34.0)
MCHC: 34 g/dL (ref 32.0–36.0)
MCV: 89.2 fL (ref 80.0–100.0)
MONO ABS: 0.5 10*3/uL (ref 0.2–1.0)
MONOS PCT: 11 %
NEUTROS ABS: 3.2 10*3/uL (ref 1.4–6.5)
Neutrophils Relative %: 62 %
Platelets: 140 10*3/uL — ABNORMAL LOW (ref 150–440)
RBC: 4.7 MIL/uL (ref 4.40–5.90)
RDW: 13.4 % (ref 11.5–14.5)
WBC: 5.1 10*3/uL (ref 3.8–10.6)

## 2016-10-31 LAB — COMPREHENSIVE METABOLIC PANEL
ALT: 51 U/L (ref 17–63)
ANION GAP: 7 (ref 5–15)
AST: 35 U/L (ref 15–41)
Albumin: 4.2 g/dL (ref 3.5–5.0)
Alkaline Phosphatase: 49 U/L (ref 38–126)
BILIRUBIN TOTAL: 0.2 mg/dL — AB (ref 0.3–1.2)
BUN: 19 mg/dL (ref 6–20)
CO2: 28 mmol/L (ref 22–32)
Calcium: 9.2 mg/dL (ref 8.9–10.3)
Chloride: 103 mmol/L (ref 101–111)
Creatinine, Ser: 0.97 mg/dL (ref 0.61–1.24)
GFR calc Af Amer: >60 mL/min (ref >60)
Glucose, Bld: 108 mg/dL — ABNORMAL HIGH (ref 65–99)
POTASSIUM: 4.2 mmol/L (ref 3.5–5.1)
Sodium: 138 mmol/L (ref 135–145)
TOTAL PROTEIN: 6.8 g/dL (ref 6.5–8.1)

## 2016-10-31 LAB — LACTATE DEHYDROGENASE: LDH: 138 U/L (ref 98–192)

## 2016-10-31 MED ORDER — IOPAMIDOL (ISOVUE-300) INJECTION 61%
150.0000 mL | Freq: Once | INTRAVENOUS | Status: AC | PRN
  Administered 2016-10-31: 125 mL via INTRAVENOUS

## 2016-11-03 ENCOUNTER — Ambulatory Visit: Payer: 59 | Admitting: Oncology

## 2016-11-07 ENCOUNTER — Other Ambulatory Visit: Payer: 59

## 2016-11-07 ENCOUNTER — Ambulatory Visit: Payer: 59

## 2016-11-07 NOTE — Progress Notes (Signed)
[place]  Telephone:(336) [phone] Fax:(336) [phone]  ID: HAWTHORNE DAY OB: [birth year]  MR#: [id number]  IRJ#:[id number]  Patient Care Team: Valerie Roys, DO as PCP - General (Family Medicine)  CHIEF COMPLAINT: Small lymphocytic lymphoma  INTERVAL HISTORY: Patient returns to clinic today for routine yearly evaluation, laboratory work, and discussion of his imaging results. He has chronic knee pain, but otherwise feels well. He has no neurologic complaints. He has a good appetite and denies weight loss. He has no dysphasia. He denies any chest pain or shortness of breath. He denies any nausea, vomiting, constipation, or diarrhea. He has no urinary complaints. Patient offers no specific complaints today.  REVIEW OF SYSTEMS:   Review of Systems  Constitutional: Negative for fever and weight loss.  Respiratory: Negative.  Negative for cough and shortness of breath.   Cardiovascular: Negative.  Negative for chest pain and leg swelling.  Gastrointestinal: Negative.  Negative for abdominal pain.  Genitourinary: Negative.   Musculoskeletal: Negative.  Negative for joint pain.  Neurological: Negative.  Negative for sensory change and weakness.  Endo/Heme/Allergies: Does not bruise/bleed easily.  Psychiatric/Behavioral: Negative.  The patient is not nervous/anxious.     As per HPI. Otherwise, a complete review of systems is negative.  PAST MEDICAL HISTORY: Past Medical History:  Diagnosis Date  . Cancer ([place]) 03/2014   Small Lymphotic Lymphoma  . Fracture    left arm  . Hyperlipidemia   . Hypertension   . Low testosterone   . Peripheral vascular disease ([place])   . Precancerous skin lesion    Eye  . Sleep apnea     PAST SURGICAL HISTORY: Past Surgical History:  Procedure Laterality Date  . APPENDECTOMY    . [place]  . LASER OF PROSTATE W/ GREEN LIGHT PVP  2014  . LEFT HEART CATH AND CORONARY ANGIOGRAPHY Left 03/21/2016   Procedure:  Left Heart Cath and Coronary Angiography;  Surgeon: Yolonda Kida, MD;  Location: [facility];  Service: Cardiovascular;  Laterality: Left;  . LYMPH NODE BIOPSY Left 03/2014   cancerous  . PROSTATE SURGERY    . SKIN BIOPSY     Eyelid  . TONSILLECTOMY    . urethra and bladder unblocked  2012    FAMILY HISTORY Family History  Problem Relation Age of Onset  . Dementia Mother 71  . Hypertension Mother   . Hypertension Father   . Heart attack Father   . Heart disease Father   . Cancer Sister        breast  . Cancer Sister        breast  . Early death Paternal Grandfather   . Heart disease Paternal Grandfather        ADVANCED DIRECTIVES:    HEALTH MAINTENANCE: Social History  Substance Use Topics  . Smoking status: Former Smoker    Quit date: 01/31/1996  . Smokeless tobacco: Never Used  . Alcohol use Yes     Comment: on occasion     Colonoscopy:  PAP:  Bone density:  Lipid panel:  Allergies  Allergen Reactions  . Adhesive [Tape] Rash    Bandaids after long term use    Current Outpatient Prescriptions  Medication Sig Dispense Refill  . benazepril (LOTENSIN) 20 MG tablet TAKE ONE & ONE-HALF TABLETS BY MOUTH ONCE DAILY 135 tablet 1  . Cholecalciferol (VITAMIN D) 2000 units CAPS Take 2,000 Units by mouth daily.    Marland Kitchen CIALIS 20 MG tablet  Take 20 mg by mouth daily as needed for erectile dysfunction.     . hydrochlorothiazide (HYDRODIURIL) 25 MG tablet Take 1 tablet (25 mg total) by mouth daily. 90 tablet 1  . Ibuprofen-Famotidine 800-26.6 MG TABS Take 1 tablet by mouth daily. 90 tablet   . Multiple Vitamin (MULTIVITAMIN WITH MINERALS) TABS tablet Take 1 tablet by mouth daily.    . Omega-3 Fatty Acids (EQL OMEGA 3 FISH OIL) 1200 MG CPDR Take 1 capsule by mouth daily. Also includes 360mg  of Omega 3 60 capsule   . rosuvastatin (CRESTOR) 20 MG tablet Take 1 tablet (20 mg total) by mouth daily. 90 tablet 1  . Testosterone (TESTOPEL IL) by Implant route.    .  triamcinolone ointment (KENALOG) 0.5 % Apply 1 application topically 2 (two) times daily. 30 g 0   No current facility-administered medications for this visit.     OBJECTIVE: Vitals:   11/10/16 1557  BP: 133/78  Pulse: (!) 55  Resp: 18  Temp: 97.9 F (36.6 C)     Body mass index is 32.92 kg/m.    ECOG FS:0 - Asymptomatic  General: Well-developed, well-nourished, no acute distress. Eyes: anicteric sclera. HEENT: Minimally palpable lymphadenopathy on left neck. Lungs: Clear to auscultation bilaterally. Heart: Regular rate and rhythm. No rubs, murmurs, or gallops. Abdomen: Soft, nontender, nondistended. No organomegaly noted, normoactive bowel sounds. Musculoskeletal: No edema, cyanosis, or clubbing. Neuro: Alert, answering all questions appropriately. Cranial nerves grossly intact. Skin: No rashes or petechiae noted. Psych: Normal affect.   LAB RESULTS:  Lab Results  Component Value Date   NA 138 10/31/2016   K 4.2 10/31/2016   CL 103 10/31/2016   CO2 28 10/31/2016   GLUCOSE 108 (H) 10/31/2016   BUN 19 10/31/2016   CREATININE 0.97 10/31/2016   CALCIUM 9.2 10/31/2016   PROT 6.8 10/31/2016   ALBUMIN 4.2 10/31/2016   AST 35 10/31/2016   ALT 51 10/31/2016   ALKPHOS 49 10/31/2016   BILITOT 0.2 (L) 10/31/2016   GFRNONAA >60 10/31/2016   GFRAA >60 10/31/2016    Lab Results  Component Value Date   WBC 5.1 10/31/2016   NEUTROABS 3.2 10/31/2016   HGB 14.3 10/31/2016   HCT 41.9 10/31/2016   MCV 89.2 10/31/2016   PLT 140 (L) 10/31/2016     STUDIES: Ct Soft Tissue Neck W Contrast  Result Date: 10/31/2016 CLINICAL DATA:  Restaging B-cell lymphoma. EXAM: CT NECK WITH CONTRAST TECHNIQUE: Multidetector CT imaging of the neck was performed using the standard protocol following the bolus administration of intravenous contrast. CONTRAST:  143mL ISOVUE-300 IOPAMIDOL (ISOVUE-300) INJECTION 61% COMPARISON:  11/02/2015 FINDINGS: Pharynx and larynx: Negative for mass or  inflammation. No thickening of Waldeyer's ring. Salivary glands: Symmetric and unremarkable. Thyroid: Normal Lymph nodes: Continued conspicuous number of cervical lymph nodes. Index measurements re- taken: Right jugulodigastric node which is decreased in size from 13 mm long axis to 10 mm. A left posterior triangle node on 6:55 has decreased to 10 x 6 mm compared to 13 x 6 mm. Asymmetric enlargement of left supraclavicular fossa nodes as before. The node measured on 6:85 measures 16 x 23 mm, similar to prior. Right supraclavicular nodes have mildly decreased in size. No new or increasing node seen. Stable nodal density, primarily homogeneously enhancing. Vascular: Major vessels are patent. Limited intracranial: Negative Visualized orbits: Not seen Mastoids and visualized paranasal sinuses: Clear where visualized Skeleton: Remote T3 superior endplate fracture. C6-7 disc degeneration with uncovertebral spurs narrowing the foramina. C3-4 facet  ankylosis. Upper chest: Reported separately. IMPRESSION: 1. History of lymphoma with bilateral cervical and supraclavicular involvement. Nodes are decreased or stable in size when compared to CT 1 year prior. No new or progressive finding. 2. Chest CT reported separately. Electronically Signed   By: Monte Fantasia M.D.   On: 10/31/2016 10:07   Ct Chest W Contrast  Result Date: 10/31/2016 CLINICAL DATA:  Non-Hodgkin's lymphoma diagnosed 2016. Small B-cell lymphoma. Yearly follow-up. EXAM: CT CHEST, ABDOMEN, AND PELVIS WITH CONTRAST TECHNIQUE: Multidetector CT imaging of the chest, abdomen and pelvis was performed following the standard protocol during bolus administration of intravenous contrast. CONTRAST:  145mL ISOVUE-300 IOPAMIDOL (ISOVUE-300) INJECTION 61% COMPARISON:  11/02/2015 FINDINGS: CT CHEST FINDINGS Cardiovascular: No significant vascular findings. Normal heart size. No pericardial effusion. Mediastinum/Nodes: Cluster small axillary lymph nodes not changed from  comparison exam. For example 11 mm LEFT axillary lymph node compares to 13 mm on prior. Small supraclavicular nodes are also noted in the thoracic inlet. Several small lymph nodes along the descending thoracic aorta are also unchanged extent the retrocrural location. Lungs/Pleura: No suspicious pulmonary nodules. Several small calcified granuloma. Musculoskeletal: No aggressive osseous lesion. CT ABDOMEN AND PELVIS FINDINGS Hepatobiliary: No focal hepatic lesion. No biliary ductal dilatation. Gallbladder is normal. Common bile duct is normal. Pancreas: Pancreas is normal. No ductal dilatation. No pancreatic inflammation. Spleen: Small 7 mm hypodense lesions in the spleen is increased from 4 mm (image 58, series 3). This measure 5 mm on CT 05/05/2015. Spleen is normal volume. Adrenals/urinary tract: Adrenal glands and kidneys are normal. The ureters and bladder normal. Stomach/Bowel: Stomach, small bowel, appendix, and cecum are normal. The colon and rectosigmoid colon are normal. Vascular/Lymphatic: Mild intimal calcification of the abdominal aorta which is nonaneurysmal. There multiple small periaortic retroperitoneal lymph nodes unchanged in number or size compared to prior. For example, node LEFT of the aorta at the level the renal veins measures 15 mm short axis compared with 15 mm. No significant iliac adenopathy. No inguinal adenopathy. No significant enlarged mesenteric lymph nodes. Reproductive: Nodular prostate is mildly enlarged Other: No peritoneal disease Musculoskeletal:  No aggressive osseous lesion IMPRESSION: Chest Impression: 1. Stable small axillary and mediastinal lymph nodes. No evidence of lymphoma progression. 2. Small benign calcified pulmonary nodules. Abdomen / Pelvis Impression: 1. Stable periaortic lymphadenopathy. 2. Small subcentimeter lesion the spleen is more prominent. Recommend attention on follow-up. Spleen is normal volume. 3. No evidence of skeletal metastasis Electronically  Signed   By: Suzy Bouchard M.D.   On: 10/31/2016 10:37   Ct Abdomen Pelvis W Contrast  Result Date: 10/31/2016 CLINICAL DATA:  Non-Hodgkin's lymphoma diagnosed 2016. Small B-cell lymphoma. Yearly follow-up. EXAM: CT CHEST, ABDOMEN, AND PELVIS WITH CONTRAST TECHNIQUE: Multidetector CT imaging of the chest, abdomen and pelvis was performed following the standard protocol during bolus administration of intravenous contrast. CONTRAST:  133mL ISOVUE-300 IOPAMIDOL (ISOVUE-300) INJECTION 61% COMPARISON:  11/02/2015 FINDINGS: CT CHEST FINDINGS Cardiovascular: No significant vascular findings. Normal heart size. No pericardial effusion. Mediastinum/Nodes: Cluster small axillary lymph nodes not changed from comparison exam. For example 11 mm LEFT axillary lymph node compares to 13 mm on prior. Small supraclavicular nodes are also noted in the thoracic inlet. Several small lymph nodes along the descending thoracic aorta are also unchanged extent the retrocrural location. Lungs/Pleura: No suspicious pulmonary nodules. Several small calcified granuloma. Musculoskeletal: No aggressive osseous lesion. CT ABDOMEN AND PELVIS FINDINGS Hepatobiliary: No focal hepatic lesion. No biliary ductal dilatation. Gallbladder is normal. Common bile duct is normal. Pancreas:  Pancreas is normal. No ductal dilatation. No pancreatic inflammation. Spleen: Small 7 mm hypodense lesions in the spleen is increased from 4 mm (image 58, series 3). This measure 5 mm on CT 05/05/2015. Spleen is normal volume. Adrenals/urinary tract: Adrenal glands and kidneys are normal. The ureters and bladder normal. Stomach/Bowel: Stomach, small bowel, appendix, and cecum are normal. The colon and rectosigmoid colon are normal. Vascular/Lymphatic: Mild intimal calcification of the abdominal aorta which is nonaneurysmal. There multiple small periaortic retroperitoneal lymph nodes unchanged in number or size compared to prior. For example, node LEFT of the aorta at  the level the renal veins measures 15 mm short axis compared with 15 mm. No significant iliac adenopathy. No inguinal adenopathy. No significant enlarged mesenteric lymph nodes. Reproductive: Nodular prostate is mildly enlarged Other: No peritoneal disease Musculoskeletal:  No aggressive osseous lesion IMPRESSION: Chest Impression: 1. Stable small axillary and mediastinal lymph nodes. No evidence of lymphoma progression. 2. Small benign calcified pulmonary nodules. Abdomen / Pelvis Impression: 1. Stable periaortic lymphadenopathy. 2. Small subcentimeter lesion the spleen is more prominent. Recommend attention on follow-up. Spleen is normal volume. 3. No evidence of skeletal metastasis Electronically Signed   By: Suzy Bouchard M.D.   On: 10/31/2016 10:37    ASSESSMENT: Small lymphocytic lymphoma.  PLAN:    1. SLL: Diagnosed on April 13, 2014 from a lymph node excision in his left neck. CT scan results from October 31, 2016 as above reviewed independently and reported as above with slight decrease in size of patient's known lymphadenopathy. It is slightly unusual patient does not have a lymphocytosis concurrent with his lymphadenopathy. His thrombocytopenia is mild and he denies any B symptoms. This is typically a very indolent process and treatment is not required.  If patient requires treatment in the future, would consider single agent Rituxan. Return to clinic in 1 year with repeat imaging and further evaluation.  2. Thrombocytopenia: Mild, monitor.  3. Joint pain: Continue monitoring and treatment per orthopedics.   Patient expressed understanding and was in agreement with this plan. He also understands that He can call clinic at any time with any questions, concerns, or complaints.    Lloyd Huger, MD   11/10/2016 4:30 PM

## 2016-11-10 ENCOUNTER — Ambulatory Visit: Payer: 59 | Admitting: Oncology

## 2016-11-10 ENCOUNTER — Inpatient Hospital Stay (HOSPITAL_BASED_OUTPATIENT_CLINIC_OR_DEPARTMENT_OTHER): Payer: 59 | Admitting: Oncology

## 2016-11-10 VITALS — BP 133/78 | HR 55 | Temp 97.9°F | Resp 18 | Wt 221.0 lb

## 2016-11-10 DIAGNOSIS — D696 Thrombocytopenia, unspecified: Secondary | ICD-10-CM

## 2016-11-10 DIAGNOSIS — C8303 Small cell B-cell lymphoma, intra-abdominal lymph nodes: Secondary | ICD-10-CM | POA: Diagnosis not present

## 2016-11-10 DIAGNOSIS — Z79899 Other long term (current) drug therapy: Secondary | ICD-10-CM

## 2016-11-10 DIAGNOSIS — M2550 Pain in unspecified joint: Secondary | ICD-10-CM | POA: Diagnosis not present

## 2016-11-10 NOTE — Progress Notes (Signed)
Patient is here for follow up, he is doing well. He mentions he has a question about his legs.

## 2016-11-16 ENCOUNTER — Ambulatory Visit (INDEPENDENT_AMBULATORY_CARE_PROVIDER_SITE_OTHER): Payer: 59 | Admitting: Family Medicine

## 2016-11-16 ENCOUNTER — Encounter: Payer: Self-pay | Admitting: Family Medicine

## 2016-11-16 VITALS — BP 121/78 | HR 63 | Temp 97.6°F | Wt 226.6 lb

## 2016-11-16 DIAGNOSIS — M9906 Segmental and somatic dysfunction of lower extremity: Secondary | ICD-10-CM | POA: Diagnosis not present

## 2016-11-16 DIAGNOSIS — M722 Plantar fascial fibromatosis: Secondary | ICD-10-CM

## 2016-11-16 MED ORDER — PREDNISONE 50 MG PO TABS: 50.0000 mg | ORAL_TABLET | Freq: Every day | ORAL | 0 refills | Status: DC

## 2016-11-16 NOTE — Progress Notes (Signed)
BP 121/78 (BP Location: Left Arm, Patient Position: Sitting, Cuff Size: Large)   Pulse 63   Temp 97.6 F (36.4 C)   Wt 226 lb 9 oz (102.8 kg)   SpO2 98%   BMI 33.75 kg/m    Subjective:    Patient ID: Jared Cowan, male    DOB: 08-13-1955, 61 y.o.   MRN: [id number]  HPI: BENTZION DAURIA is a 61 y.o. male  Chief Complaint  Patient presents with  . Foot Pain    left, some heel pain in right   FOOT PAIN Duration: 4-5 weeks Involved foot: bilateral Mechanism of injury: unknown Location: inside of his heel Onset: gradual  Severity: moderate  Quality:  aching and burning Frequency: constant Radiation: no Aggravating factors: first step in the AM, weight bearing and walking  Alleviating factors: brace  Status: worse Treatments attempted: rest, ice, ibuprofen and HEP  Relief with NSAIDs?:  mild Weakness with weight bearing or walking: no Morning stiffness: yes Swelling: no Redness: no Bruising: no Paresthesias / decreased sensation: no  Fevers:no  Relevant past medical, surgical, family and social history reviewed and updated as indicated. Interim medical history since our last visit reviewed. Allergies and medications reviewed and updated.  Review of Systems  Constitutional: Negative.   Respiratory: Negative.   Musculoskeletal: Positive for gait problem and myalgias. Negative for arthralgias, back pain, joint swelling, neck pain and neck stiffness.  Psychiatric/Behavioral: Negative.     Per HPI unless specifically indicated above     Objective:    BP 121/78 (BP Location: Left Arm, Patient Position: Sitting, Cuff Size: Large)   Pulse 63   Temp 97.6 F (36.4 C)   Wt 226 lb 9 oz (102.8 kg)   SpO2 98%   BMI 33.75 kg/m   Wt Readings from Last 3 Encounters:  11/16/16 226 lb 9 oz (102.8 kg)  11/10/16 221 lb (100.2 kg)  09/25/16 221 lb 7 oz (100.4 kg)    Physical Exam  Constitutional: He is oriented to person, place, and time. He appears well-developed  and well-nourished. No distress.  HENT:  Head: Normocephalic and atraumatic.  Right Ear: Hearing normal.  Left Ear: Hearing normal.  Nose: Nose normal.  Eyes: Conjunctivae and lids are normal. Right eye exhibits no discharge. Left eye exhibits no discharge. No scleral icterus.  Cardiovascular: Normal rate, regular rhythm, normal heart sounds and intact distal pulses.  Exam reveals no gallop and no friction rub.   No murmur heard. Pulmonary/Chest: Effort normal and breath sounds normal. No respiratory distress. He has no wheezes. He has no rales. He exhibits no tenderness.  Musculoskeletal: He exhibits tenderness (bilateral plantar fascia).  Neurological: He is alert and oriented to person, place, and time.  Skin: Skin is warm, dry and intact. No rash noted. No erythema. No pallor.  Psychiatric: He has a normal mood and affect. His speech is normal and behavior is normal. Judgment and thought content normal. Cognition and memory are normal.  Nursing note and vitals reviewed. Musculoskeletal:  Exam found Decreased ROM, Tissue texture changes, Tenderness to palpation and Asymmetry of patient's  Lower extremities Osteopathic Structural Exam:   Lower Extremity: bilateral tenderness and restriction of plantar fascias   Results for orders placed or performed in visit on 10/31/16  CBC with Differential  Result Value Ref Range   WBC 5.1 3.8 - 10.6 K/uL   RBC 4.70 4.40 - 5.90 MIL/uL   Hemoglobin 14.3 13.0 - 18.0 g/dL   HCT 41.9 40.0 -  52.0 %   MCV 89.2 80.0 - 100.0 fL   MCH 30.3 26.0 - 34.0 pg   MCHC 34.0 32.0 - 36.0 g/dL   RDW 13.4 11.5 - 14.5 %   Platelets 140 (L) 150 - 440 K/uL   Neutrophils Relative % 62 %   Neutro Abs 3.2 1.4 - 6.5 K/uL   Lymphocytes Relative 24 %   Lymphs Abs 1.2 1.0 - 3.6 K/uL   Monocytes Relative 11 %   Monocytes Absolute 0.5 0.2 - 1.0 K/uL   Eosinophils Relative 2 %   Eosinophils Absolute 0.1 0 - 0.7 K/uL   Basophils Relative 1 %   Basophils Absolute 0.0 0 -  0.1 K/uL  Comprehensive metabolic panel  Result Value Ref Range   Sodium 138 135 - 145 mmol/L   Potassium 4.2 3.5 - 5.1 mmol/L   Chloride 103 101 - 111 mmol/L   CO2 28 22 - 32 mmol/L   Glucose, Bld 108 (H) 65 - 99 mg/dL   BUN 19 6 - 20 mg/dL   Creatinine, Ser 0.97 0.61 - 1.24 mg/dL   Calcium 9.2 8.9 - 10.3 mg/dL   Total Protein 6.8 6.5 - 8.1 g/dL   Albumin 4.2 3.5 - 5.0 g/dL   AST 35 15 - 41 U/L   ALT 51 17 - 63 U/L   Alkaline Phosphatase 49 38 - 126 U/L   Total Bilirubin 0.2 (L) 0.3 - 1.2 mg/dL   GFR calc non Af Amer >60 >60 mL/min   GFR calc Af Amer >60 >60 mL/min   Anion gap 7 5 - 15  Lactate dehydrogenase  Result Value Ref Range   LDH 138 98 - 192 U/L      Assessment & Plan:   Problem List Items Addressed This Visit    None    Visit Diagnoses    Plantar fasciitis    -  Primary   Rx for prednisone if it gets bad on vacation. Braces, Exercises. Insoles. Call if not improving or worsening. Tx'd today with OMT   Somatic dysfunction of lower extremity         After verbal consent was obtained, patient was treated today with osteopathic manipulative medicine to the regions of the lower extremity using the techniques of myofascial release and counterstrain. Areas of compensation relating to his primary pain source also treated. Patient tolerated the procedure well with fair objective and fair subjective improvement in symptoms. He left the room in good condition. He was advised to stay well hydrated and that he may have some soreness following the procedure. If not improving or worsening, he will call and come in. Home exercise program of stretches for plantar fascia discussed and demonstrated today. Patient will do these stretches BID to before the point of pain, and will return for reevaluation  on a PRN basis.   Follow up plan: Return if symptoms worsen or fail to improve.

## 2016-11-16 NOTE — Patient Instructions (Addendum)

## 2016-11-26 ENCOUNTER — Encounter: Payer: Self-pay | Admitting: Family Medicine

## 2016-11-27 ENCOUNTER — Telehealth: Payer: Self-pay | Admitting: Gastroenterology

## 2016-11-27 NOTE — Telephone Encounter (Signed)
Patient left a voice message that he needs to reschedule his colonoscopy due to no transportation. Please call

## 2016-11-28 ENCOUNTER — Other Ambulatory Visit: Payer: Self-pay

## 2016-11-28 DIAGNOSIS — Z1211 Encounter for screening for malignant neoplasm of colon: Secondary | ICD-10-CM

## 2016-11-28 NOTE — Telephone Encounter (Signed)
Patients call has been returned to reschedule screening colonoscopy.  Colonoscopy has been rescheduled to 12/13/16 at [facility] with Dr. Marius Ditch. Endo at [facility] has been notified.  New ordered has been entered for [facility].  New paperwork will be mailed.  Thanks [employer]

## 2016-11-29 ENCOUNTER — Encounter: Payer: Self-pay | Admitting: *Deleted

## 2016-12-07 ENCOUNTER — Encounter: Admission: RE | Payer: Self-pay | Source: Ambulatory Visit

## 2016-12-07 ENCOUNTER — Ambulatory Visit: Admission: RE | Admit: 2016-12-07 | Payer: 59 | Source: Ambulatory Visit | Admitting: Gastroenterology

## 2016-12-07 SURGERY — COLONOSCOPY WITH PROPOFOL
Anesthesia: General

## 2016-12-12 NOTE — Discharge Instructions (Signed)
General Anesthesia, Adult, Care After °These instructions provide you with information about caring for yourself after your procedure. Your health care provider may also give you more specific instructions. Your treatment has been planned according to current medical practices, but problems sometimes occur. Call your health care provider if you have any problems or questions after your procedure. °What can I expect after the procedure? °After the procedure, it is common to have: °· Vomiting. °· A sore throat. °· Mental slowness. ° °It is common to feel: °· Nauseous. °· Cold or shivery. °· Sleepy. °· Tired. °· Sore or achy, even in parts of your body where you did not have surgery. ° °Follow these instructions at home: °For at least 24 hours after the procedure: °· Do not: °? Participate in activities where you could fall or become injured. °? Drive. °? Use heavy machinery. °? Drink alcohol. °? Take sleeping pills or medicines that cause drowsiness. °? Make important decisions or sign legal documents. °? Take care of children on your own. °· Rest. °Eating and drinking °· If you vomit, drink water, juice, or soup when you can drink without vomiting. °· Drink enough fluid to keep your urine clear or pale yellow. °· Make sure you have little or no nausea before eating solid foods. °· Follow the diet recommended by your health care provider. °General instructions °· Have a responsible adult stay with you until you are awake and alert. °· Return to your normal activities as told by your health care provider. Ask your health care provider what activities are safe for you. °· Take over-the-counter and prescription medicines only as told by your health care provider. °· If you smoke, do not smoke without supervision. °· Keep all follow-up visits as told by your health care provider. This is important. °Contact a health care provider if: °· You continue to have nausea or vomiting at home, and medicines are not helpful. °· You  cannot drink fluids or start eating again. °· You cannot urinate after 8-12 hours. °· You develop a skin rash. °· You have fever. °· You have increasing redness at the site of your procedure. °Get help right away if: °· You have difficulty breathing. °· You have chest pain. °· You have unexpected bleeding. °· You feel that you are having a life-threatening or urgent problem. °This information is not intended to replace advice given to you by your health care provider. Make sure you discuss any questions you have with your health care provider. °Document Released: 04/24/2000 Document Revised: 06/21/2015 Document Reviewed: 12/31/2014 °Elsevier Interactive Patient Education © 2018 Else[employer] Inc. ° °

## 2016-12-13 ENCOUNTER — Ambulatory Visit: Payer: 59 | Admitting: Anesthesiology

## 2016-12-13 ENCOUNTER — Ambulatory Visit
Admission: RE | Admit: 2016-12-13 | Discharge: 2016-12-13 | Disposition: A | Discharge status: Home / Self Care | Payer: 59 | Source: Ambulatory Visit | Attending: Gastroenterology | Admitting: Gastroenterology

## 2016-12-13 ENCOUNTER — Encounter
Admission: RE | Disposition: A | Discharge status: Home / Self Care | Payer: Self-pay | Source: Ambulatory Visit | Attending: Gastroenterology

## 2016-12-13 DIAGNOSIS — Z87891 Personal history of nicotine dependence: Secondary | ICD-10-CM | POA: Diagnosis not present

## 2016-12-13 DIAGNOSIS — Z8249 Family history of ischemic heart disease and other diseases of the circulatory system: Secondary | ICD-10-CM | POA: Insufficient documentation

## 2016-12-13 DIAGNOSIS — Z888 Allergy status to other drugs, medicaments and biological substances status: Secondary | ICD-10-CM | POA: Insufficient documentation

## 2016-12-13 DIAGNOSIS — Z86010 Personal history of colonic polyps: Secondary | ICD-10-CM | POA: Insufficient documentation

## 2016-12-13 DIAGNOSIS — Z79899 Other long term (current) drug therapy: Secondary | ICD-10-CM | POA: Insufficient documentation

## 2016-12-13 DIAGNOSIS — I10 Essential (primary) hypertension: Secondary | ICD-10-CM | POA: Diagnosis not present

## 2016-12-13 DIAGNOSIS — E785 Hyperlipidemia, unspecified: Secondary | ICD-10-CM | POA: Diagnosis not present

## 2016-12-13 DIAGNOSIS — D120 Benign neoplasm of cecum: Secondary | ICD-10-CM | POA: Diagnosis not present

## 2016-12-13 DIAGNOSIS — D124 Benign neoplasm of descending colon: Secondary | ICD-10-CM | POA: Diagnosis not present

## 2016-12-13 DIAGNOSIS — Z9989 Dependence on other enabling machines and devices: Secondary | ICD-10-CM | POA: Diagnosis not present

## 2016-12-13 DIAGNOSIS — I739 Peripheral vascular disease, unspecified: Secondary | ICD-10-CM | POA: Diagnosis not present

## 2016-12-13 DIAGNOSIS — K621 Rectal polyp: Secondary | ICD-10-CM | POA: Insufficient documentation

## 2016-12-13 DIAGNOSIS — Z1211 Encounter for screening for malignant neoplasm of colon: Secondary | ICD-10-CM | POA: Diagnosis not present

## 2016-12-13 DIAGNOSIS — G4730 Sleep apnea, unspecified: Secondary | ICD-10-CM | POA: Diagnosis not present

## 2016-12-13 DIAGNOSIS — Z8572 Personal history of non-Hodgkin lymphomas: Secondary | ICD-10-CM | POA: Insufficient documentation

## 2016-12-13 HISTORY — DX: Plantar fascial fibromatosis: M72.2

## 2016-12-13 HISTORY — DX: Adhesive capsulitis of unspecified shoulder: M75.00

## 2016-12-13 HISTORY — DX: Presence of dental prosthetic device (complete) (partial): Z97.2

## 2016-12-13 HISTORY — PX: COLONOSCOPY WITH PROPOFOL: SHX5780

## 2016-12-13 HISTORY — DX: Unspecified hemorrhoids: K64.9

## 2016-12-13 SURGERY — COLONOSCOPY WITH PROPOFOL
Anesthesia: Monitor Anesthesia Care | Wound class: Clean Contaminated

## 2016-12-13 MED ORDER — FENTANYL CITRATE (PF) 100 MCG/2ML IJ SOLN: 25.0000 ug | INTRAMUSCULAR | Status: DC | PRN

## 2016-12-13 MED ORDER — LIDOCAINE HCL (CARDIAC) 20 MG/ML IV SOLN
INTRAVENOUS | Status: DC | PRN
  Administered 2016-12-13: 50 mg via INTRAVENOUS

## 2016-12-13 MED ORDER — LACTATED RINGERS IV SOLN
10.0000 mL/h | INTRAVENOUS | Status: DC
  Administered 2016-12-13: 10 mL/h via INTRAVENOUS

## 2016-12-13 MED ORDER — PROMETHAZINE HCL 25 MG/ML IJ SOLN: 6.2500 mg | INTRAMUSCULAR | Status: DC | PRN

## 2016-12-13 MED ORDER — OXYCODONE HCL 5 MG/5ML PO SOLN: 5.0000 mg | Freq: Once | ORAL | Status: DC | PRN

## 2016-12-13 MED ORDER — OXYCODONE HCL 5 MG PO TABS: 5.0000 mg | ORAL_TABLET | Freq: Once | ORAL | Status: DC | PRN

## 2016-12-13 MED ORDER — PROPOFOL 10 MG/ML IV BOLUS
INTRAVENOUS | Status: DC | PRN
  Administered 2016-12-13: 40 mg via INTRAVENOUS
  Administered 2016-12-13: 20 mg via INTRAVENOUS
  Administered 2016-12-13: 10 mg via INTRAVENOUS
  Administered 2016-12-13: 20 mg via INTRAVENOUS
  Administered 2016-12-13: 40 mg via INTRAVENOUS
  Administered 2016-12-13 (×2): 50 mg via INTRAVENOUS
  Administered 2016-12-13: 20 mg via INTRAVENOUS
  Administered 2016-12-13: 50 mg via INTRAVENOUS
  Administered 2016-12-13: 30 mg via INTRAVENOUS
  Administered 2016-12-13: 20 mg via INTRAVENOUS
  Administered 2016-12-13: 30 mg via INTRAVENOUS
  Administered 2016-12-13: 40 mg via INTRAVENOUS
  Administered 2016-12-13: 100 mg via INTRAVENOUS
  Administered 2016-12-13: 30 mg via INTRAVENOUS

## 2016-12-13 MED ORDER — MEPERIDINE HCL 25 MG/ML IJ SOLN: 6.2500 mg | INTRAMUSCULAR | Status: DC | PRN

## 2016-12-13 MED ORDER — SODIUM CHLORIDE 0.9 % IV SOLN: INTRAVENOUS | Status: DC

## 2016-12-13 SURGICAL SUPPLY — 23 items
CANISTER SUCT 1200ML W/VALVE (MISCELLANEOUS) ×2 IMPLANT
CLIP HMST 235XBRD CATH ROT (MISCELLANEOUS) IMPLANT
CLIP RESOLUTION 360 11X235 (MISCELLANEOUS)
FCP ESCP3.2XJMB 240X2.8X (MISCELLANEOUS)
FORCEPS BIOP RAD 4 LRG CAP 4 (CUTTING FORCEPS) IMPLANT
FORCEPS BIOP RJ4 240 W/NDL (MISCELLANEOUS)
FORCEPS ESCP3.2XJMB 240X2.8X (MISCELLANEOUS) IMPLANT
GOWN CVR UNV OPN BCK APRN NK (MISCELLANEOUS) ×2 IMPLANT
GOWN ISOL THUMB LOOP REG UNIV (MISCELLANEOUS) ×2
INJECTOR VARIJECT VIN23 (MISCELLANEOUS) IMPLANT
KIT DEFENDO VALVE AND CONN (KITS) IMPLANT
KIT ENDO PROCEDURE OLY (KITS) ×2 IMPLANT
MARKER SPOT ENDO TATTOO 5ML (MISCELLANEOUS) IMPLANT
PAD GROUND ADULT SPLIT (MISCELLANEOUS) ×2 IMPLANT
PROBE APC STR FIRE (PROBE) IMPLANT
RETRIEVER NET ROTH 2.5X230 LF (MISCELLANEOUS) IMPLANT
SNARE SHORT THROW 13M SML OVAL (MISCELLANEOUS) ×2 IMPLANT
SNARE SHORT THROW 30M LRG OVAL (MISCELLANEOUS) IMPLANT
SNARE SNG USE RND 15MM (INSTRUMENTS) IMPLANT
SPOT EX ENDOSCOPIC TATTOO (MISCELLANEOUS)
TRAP ETRAP POLY (MISCELLANEOUS) ×2 IMPLANT
VARIJECT INJECTOR VIN23 (MISCELLANEOUS)
WATER STERILE IRR 250ML POUR (IV SOLUTION) ×2 IMPLANT

## 2016-12-13 NOTE — Op Note (Signed)
[facility] Gastroenterology Patient Name: Jared Cowan Procedure Date: 12/13/2016 8:32 AM MRN: [id number] Account #: [id number] Date of Birth: 03-07-1955 Admit Type: Outpatient Age: 61 Room: [facility] OR ROOM 01 Gender: Male Note Status: Finalized Procedure:            Colonoscopy Indications:          High risk colon cancer surveillance: Personal history                        of colonic polyps, Surveillance: Personal history of                        colonic polyps (unknown histology) on last colonoscopy                        more than 5 years ago Providers:            Lin Landsman MD, MD Referring MD:         Valerie Roys (Referring MD) Medicines:            Monitored Anesthesia Care Complications:        No immediate complications. Estimated blood loss: None. Procedure:            Pre-Anesthesia Assessment:                       - Prior to the procedure, a History and Physical was                        performed, and patient medications and allergies were                        reviewed. The patient is competent. The risks and                        benefits of the procedure and the sedation options and                        risks were discussed with the patient. All questions                        were answered and informed consent was obtained.                        Patient identification and proposed procedure were                        verified by the physician, the nurse, the                        anesthesiologist, the anesthetist and the technician in                        the pre-procedure area in the procedure room. Mental                        Status Examination: alert and oriented. Airway                        Examination: normal oropharyngeal airway and neck  mobility. Respiratory Examination: clear to                        auscultation. CV Examination: normal. Prophylactic   Antibiotics: The patient does not require prophylactic                        antibiotics. Prior Anticoagulants: The patient has                        taken no previous anticoagulant or antiplatelet agents.                        ASA Grade Assessment: III - A patient with severe                        systemic disease. After reviewing the risks and                        benefits, the patient was deemed in satisfactory                        condition to undergo the procedure. The anesthesia plan                        was to use monitored anesthesia care (MAC). Immediately                        prior to administration of medications, the patient was                        re-assessed for adequacy to receive sedatives. The                        heart rate, respiratory rate, oxygen saturations, blood                        pressure, adequacy of pulmonary ventilation, and                        response to care were monitored throughout the                        procedure. The physical status of the patient was                        re-assessed after the procedure.                       After obtaining informed consent, the colonoscope was                        passed under direct vision. Throughout the procedure,                        the patient's blood pressure, pulse, and oxygen                        saturations were monitored continuously. The Olympus  [place] [id number]) was introduced through the                        anus and advanced to the the terminal ileum. The                        colonoscopy was performed without difficulty. The                        patient tolerated the procedure well. The quality of                        the bowel preparation was evaluated using the BBPS                        [facility] Bowel Preparation Scale) with scores of: Right                        Colon = 3, Transverse Colon = 3 and Left Colon = 3                         (entire mucosa seen well with no residual staining,                        small fragments of stool or opaque liquid). The total                        BBPS score equals 9. Findings:      The perianal and digital rectal examinations were normal. Pertinent       negatives include normal sphincter tone and no palpable rectal lesions.      The terminal ileum appeared normal.      Three sessile polyps were found in the descending colon and cecum. The       polyps were 6 to 9 mm in size. These polyps were removed with a hot       snare. Resection and retrieval were complete.      A 5 mm polyp was found in the rectum. The polyp was flat. The polyp was       removed with a hot snare. Resection and retrieval were complete.      A 4 mm polyp was found in the rectum. The polyp was flat. The polyp was       removed with a cold snare. Resection and retrieval were complete.      The retroflexed view of the distal rectum and anal verge was normal and       showed no anal or rectal abnormalities. Impression:           - The examined portion of the ileum was normal.                       - Three 6 to 9 mm polyps in the descending colon and in                        the cecum, removed with a hot snare. Resected and                        retrieved.                       -  One 5 mm polyp in the rectum, removed with a hot                        snare. Resected and retrieved.                       - One 4 mm polyp in the rectum, removed with a cold                        snare. Resected and retrieved.                       - The distal rectum and anal verge are normal on                        retroflexion view. Recommendation:       - Discharge patient to home.                       - Resume previous diet today.                       - Continue present medications.                       - Await pathology results.                       - Repeat colonoscopy in 3 years for surveillance of                         multiple polyps. Procedure Code(s):    --- Professional ---                       [id number], Colonoscopy, flexible; with removal of tumor(s),                        polyp(s), or other lesion(s) by snare technique Diagnosis Code(s):    --- Professional ---                       D12.4, Benign neoplasm of descending colon                       D12.0, Benign neoplasm of cecum                       K62.1, Rectal polyp                       Z86.010, Personal history of colonic polyps CPT copyright 2016 American Medical Association. All rights reserved. The codes documented in this report are preliminary and upon coder review may  be revised to meet current compliance requirements. Dr. Ulyess Mort Lin Landsman MD, MD 12/13/2016 9:26:51 AM This report has been signed electronically. Number of Addenda: 0 Note Initiated On: 12/13/2016 8:32 AM Scope Withdrawal Time: 0 hours 24 minutes 29 seconds  Total Procedure Duration: 0 hours 29 minutes 55 seconds       [facility]

## 2016-12-13 NOTE — Anesthesia Procedure Notes (Signed)
Performed by: Jaileigh Weimer, CRNA Pre-anesthesia Checklist: Patient identified, Emergency Drugs available, Suction available, Timeout performed and Patient being monitored Patient Re-evaluated:Patient Re-evaluated prior to induction Oxygen Delivery Method: Nasal cannula Placement Confirmation: positive ETCO2       

## 2016-12-13 NOTE — Anesthesia Preprocedure Evaluation (Signed)
Anesthesia Evaluation  Patient identified by MRN, date of birth, ID band Patient awake    Reviewed: Allergy & Precautions, H&P , NPO status , Patient's Chart, lab work & pertinent test results  Airway Mallampati: II  TM Distance: >3 FB Neck ROM: full    Dental   Pulmonary former smoker,    Pulmonary exam normal        Cardiovascular hypertension, On Medications Normal cardiovascular exam     Neuro/Psych    GI/Hepatic negative GI ROS, Neg liver ROS,   Endo/Other  negative endocrine ROS  Renal/GU negative Renal ROS     Musculoskeletal   Abdominal   Peds  Hematology negative hematology ROS (+)   Anesthesia Other Findings   Reproductive/Obstetrics                             Anesthesia Physical Anesthesia Plan  ASA: II  Anesthesia Plan: MAC and General   Post-op Pain Management:    Induction:   PONV Risk Score and Plan:   Airway Management Planned:   Additional Equipment:   Intra-op Plan:   Post-operative Plan:   Informed Consent: I have reviewed the patients History and Physical, chart, labs and discussed the procedure including the risks, benefits and alternatives for the proposed anesthesia with the patient or authorized representative who has indicated his/her understanding and acceptance.     Plan Discussed with:   Anesthesia Plan Comments:         Anesthesia Quick Evaluation

## 2016-12-13 NOTE — H&P (Signed)
Cephas Darby, MD [address]  [place]  [place], [place] [address]  Main: [phone]  Fax: [phone] Pager: [phone]  Primary Care Physician:  Valerie Roys, DO Primary Gastroenterologist:  Dr. Cephas Darby  Pre-Procedure History & Physical: HPI:  MIVAAN CORBITT is a 61 y.o. male is here for an colonoscopy.   Past Medical History:  Diagnosis Date  . 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 Fracture    left arm  . 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 Hemorrhoids   . Hyperlipidemia   . Hypertension   . Low testosterone   . Peripheral vascular disease ([place])   . Plantar fasciitis of left foot   . Precancerous skin lesion    Eye  . Sleep apnea    CPAP  . Wears dentures    full upper, partial lower    Past Surgical History:  Procedure Laterality Date  . APPENDECTOMY    . [place]  . LASER OF PROSTATE W/ GREEN LIGHT PVP  2014  . LYMPH NODE BIOPSY Left 03/2014   cancerous  . PROSTATE SURGERY    . SKIN BIOPSY     Eyelid  . TONSILLECTOMY    . urethra and bladder unblocked  2012    Prior to Admission medications   Medication Sig Start Date End Date Taking? Authorizing Provider  benazepril (LOTENSIN) 20 MG tablet TAKE ONE & ONE-HALF TABLETS BY MOUTH ONCE DAILY 09/25/16  Yes Johnson, Megan P, DO  hydrochlorothiazide (HYDRODIURIL) 25 MG tablet Take 1 tablet (25 mg total) by mouth daily. 09/25/16  Yes Johnson, Megan P, DO  Ibuprofen-Famotidine 800-26.6 MG TABS Take 1 tablet by mouth daily. 09/22/16  Yes Johnson, Megan P, DO  Multiple Vitamin (MULTIVITAMIN WITH MINERALS) TABS tablet Take 1 tablet by mouth daily.   Yes [provider]  Omega-3 Fatty Acids (EQL OMEGA 3 FISH OIL) 1200 MG CPDR Take 1 capsule by mouth daily. Also includes 360mg  of Omega 3 09/22/16  Yes Johnson, Megan P, DO  rosuvastatin (CRESTOR) 20 MG tablet Take 1 tablet (20 mg total) by mouth daily. 09/25/16  Yes Johnson, Megan P, DO  CIALIS 20  MG tablet Take 20 mg by mouth daily as needed for erectile dysfunction.  11/25/14   [provider]  Testosterone (TESTOPEL IL) by Implant route.    [provider]    Allergies as of 11/28/2016 - Review Complete 11/16/2016  Allergen Reaction Noted  . Adhesive [tape] Rash 06/22/2015    Family History  Problem Relation Age of Onset  . Dementia Mother 26  . Hypertension Mother   . Hypertension Father   . Heart attack Father   . Heart disease Father   . Cancer Sister        breast  . Cancer Sister        breast  . Early death Paternal Grandfather   . Heart disease Paternal Grandfather     Social History   Socioeconomic History  . Marital status: Married    Spouse name: Not on file  . Number of children: Not on file  . Years of education: Not on file  . Highest education level: Not on file  Social Needs  . Financial resource strain: Not on file  . Food insecurity - worry: Not on file  . Food insecurity - inability: Not on file  . Transportation needs - medical: Not on file  .  Transportation needs - non-medical: Not on file  Occupational History  . Not on file  Tobacco Use  . Smoking status: Former Smoker    Last attempt to quit: 01/31/1996    Years since quitting: 20.8  . Smokeless tobacco: Never Used  Substance and Sexual Activity  . Alcohol use: Yes    Alcohol/week: 8.4 oz    Types: 14 Shots of liquor per week  . Drug use: No  . Sexual activity: Not Currently  Other Topics Concern  . Not on file  Social History Narrative  . Not on file    Review of Systems: See HPI, otherwise negative ROS  Physical Exam: Ht 5' 8.7" (1.745 m)   Wt 226 lb 12 oz (102.9 kg)   BMI 33.78 kg/m  General:   Alert,  pleasant and cooperative in NAD Head:  Normocephalic and atraumatic. Neck:  Supple; no masses or thyromegaly. Lungs:  Clear throughout to auscultation.    Heart:  Regular rate and rhythm. Abdomen:  Soft, nontender and nondistended. Normal bowel  sounds, without guarding, and without rebound.   Neurologic:  Alert and  oriented x4;  grossly normal neurologically.  Impression/Plan: [name] is here for an colonoscopy to be performed for screening colon cancer  Risks, benefits, limitations, and alternatives regarding  colonoscopy have been reviewed with the patient.  Questions have been answered.  All parties agreeable.   Sherri Sear, MD  12/13/2016, 7:54 AM

## 2016-12-13 NOTE — Transfer of Care (Signed)
Immediate Anesthesia Transfer of Care Note  Patient: Jared Cowan  Procedure(s) Performed: COLONOSCOPY WITH PROPOFOL (N/A )  Patient Location: PACU  Anesthesia Type: MAC, General  Level of Consciousness: awake, alert  and patient cooperative  Airway and Oxygen Therapy: Patient Spontanous Breathing and Patient connected to supplemental oxygen  Post-op Assessment: Post-op Vital signs reviewed, Patient's Cardiovascular Status Stable, Respiratory Function Stable, Patent Airway and No signs of Nausea or vomiting  Post-op Vital Signs: Reviewed and stable  Complications: No apparent anesthesia complications

## 2016-12-13 NOTE — Anesthesia Postprocedure Evaluation (Signed)
Anesthesia Post Note  Patient: Jared Cowan  Procedure(s) Performed: COLONOSCOPY WITH PROPOFOL (N/A )  Patient location during evaluation: PACU Anesthesia Type: MAC Level of consciousness: awake and alert Pain management: pain level controlled Vital Signs Assessment: post-procedure vital signs reviewed and stable Respiratory status: spontaneous breathing Cardiovascular status: blood pressure returned to baseline Postop Assessment: no headache Anesthetic complications: no    Jaci Standard, III,  Cylie Dor D

## 2016-12-14 ENCOUNTER — Encounter: Payer: Self-pay | Admitting: Gastroenterology

## 2016-12-18 ENCOUNTER — Telehealth: Payer: Self-pay | Admitting: Family Medicine

## 2016-12-18 MED ORDER — BENAZEPRIL HCL 20 MG PO TABS: ORAL_TABLET | ORAL | 1 refills | Status: DC

## 2016-12-18 NOTE — Telephone Encounter (Signed)
Copied from [place] [id number]. Topic: Quick Communication - See Telephone Encounter >> Dec 18, 2016  2:04 PM Hewitt Shorts wrote: CRM for notification. See Telephone encounter for: 12/18/16.pt is asking for a refill on benazepril  Best number [phone]  Pt had OV 09/25/16

## 2017-01-09 ENCOUNTER — Encounter: Payer: Self-pay | Admitting: Family Medicine

## 2017-01-10 MED ORDER — HYDROCHLOROTHIAZIDE 25 MG PO TABS: 25.0000 mg | ORAL_TABLET | Freq: Every day | ORAL | 1 refills | Status: DC

## 2017-01-10 MED ORDER — ROSUVASTATIN CALCIUM 20 MG PO TABS: 20.0000 mg | ORAL_TABLET | Freq: Every day | ORAL | 1 refills | Status: DC

## 2017-01-10 MED ORDER — BENAZEPRIL HCL 20 MG PO TABS: ORAL_TABLET | ORAL | 1 refills | Status: DC

## 2017-01-24 MED ORDER — ROSUVASTATIN CALCIUM 20 MG PO TABS: 20.0000 mg | ORAL_TABLET | Freq: Every day | ORAL | 1 refills | Status: DC

## 2017-01-24 MED ORDER — HYDROCHLOROTHIAZIDE 25 MG PO TABS: 25.0000 mg | ORAL_TABLET | Freq: Every day | ORAL | 1 refills | Status: DC

## 2017-01-24 MED ORDER — BENAZEPRIL HCL 20 MG PO TABS: ORAL_TABLET | ORAL | 1 refills | Status: DC

## 2017-01-24 NOTE — Addendum Note (Signed)
Addended by: Valerie Roys on: 01/24/2017 12:41 PM   Modules accepted: Orders

## 2017-01-24 NOTE — Telephone Encounter (Signed)
Jared Cowan, I definitely sent 90 days on his meds- can we check to make sure they got the new Rx

## 2017-02-01 ENCOUNTER — Encounter: Payer: Self-pay | Admitting: Family Medicine

## 2017-02-02 ENCOUNTER — Encounter: Payer: Self-pay | Admitting: Family Medicine

## 2017-02-02 ENCOUNTER — Other Ambulatory Visit: Payer: Self-pay | Admitting: Family Medicine

## 2017-02-02 DIAGNOSIS — R7989 Other specified abnormal findings of blood chemistry: Secondary | ICD-10-CM

## 2017-02-02 NOTE — Progress Notes (Signed)
test

## 2017-02-15 ENCOUNTER — Ambulatory Visit
Admission: RE | Admit: 2017-02-15 | Discharge: 2017-02-15 | Disposition: A | Discharge status: Home / Self Care | Payer: 59 | Source: Ambulatory Visit | Attending: Family Medicine | Admitting: Family Medicine

## 2017-02-15 ENCOUNTER — Ambulatory Visit: Payer: 59 | Admitting: Family Medicine

## 2017-02-15 ENCOUNTER — Encounter: Payer: Self-pay | Admitting: Family Medicine

## 2017-02-15 VITALS — BP 112/73 | HR 69 | Temp 97.6°F | Wt 237.0 lb

## 2017-02-15 DIAGNOSIS — M5412 Radiculopathy, cervical region: Secondary | ICD-10-CM | POA: Diagnosis present

## 2017-02-15 DIAGNOSIS — M19011 Primary osteoarthritis, right shoulder: Secondary | ICD-10-CM | POA: Insufficient documentation

## 2017-02-15 MED ORDER — CYCLOBENZAPRINE HCL 10 MG PO TABS: 10.0000 mg | ORAL_TABLET | Freq: Every day | ORAL | 0 refills | Status: DC

## 2017-02-15 NOTE — Progress Notes (Signed)
BP 112/73 (BP Location: Left Arm, Patient Position: Sitting, Cuff Size: Large)   Pulse 69   Temp 97.6 F (36.4 C)   Wt 237 lb (107.5 kg)   SpO2 96%   BMI 35.31 kg/m    Subjective:    Patient ID: Jared Cowan, male    DOB: 04/03/1955, 62 y.o.   MRN: [id number]  HPI: Jared Cowan is a 62 y.o. male  Chief Complaint  Patient presents with  . Joint pain   SHOULDER PAIN Duration: 3 weeks Involved shoulder: right Mechanism of injury: unknown Location: all the way down his R arm Onset:gradual Severity: moderate  Quality:  Numb and tingling and weakness, aching Frequency: constant Radiation: yes- all the way into the hand Aggravating factors: filling his water bottle, sleeping on it    Alleviating factors: nothing  Status: worse Treatments attempted: massage and ibuprofen  Relief with NSAIDs?:  moderate Weakness: yes Numbness: yes Decreased grip strength: yes Redness: no Swelling: no Bruising: no Fevers: no   Knees still really bothering him, not ready to see orthopedics yet.   Relevant past medical, surgical, family and social history reviewed and updated as indicated. Interim medical history since our last visit reviewed. Allergies and medications reviewed and updated.  Review of Systems  Constitutional: Negative.   Respiratory: Negative.   Cardiovascular: Negative.   Musculoskeletal: Positive for arthralgias, myalgias, neck pain and neck stiffness. Negative for back pain, gait problem and joint swelling.  Skin: Negative.   Neurological: Positive for numbness. Negative for dizziness, tremors, seizures, syncope, facial asymmetry, speech difficulty, weakness, light-headedness and headaches.  Psychiatric/Behavioral: Negative.     Per HPI unless specifically indicated above     Objective:    BP 112/73 (BP Location: Left Arm, Patient Position: Sitting, Cuff Size: Large)   Pulse 69   Temp 97.6 F (36.4 C)   Wt 237 lb (107.5 kg)   SpO2 96%   BMI 35.31  kg/m   Wt Readings from Last 3 Encounters:  02/15/17 237 lb (107.5 kg)  12/13/16 225 lb (102.1 kg)  11/16/16 226 lb 9 oz (102.8 kg)    Physical Exam  Constitutional: He is oriented to person, place, and time. He appears well-developed and well-nourished. No distress.  HENT:  Head: Normocephalic and atraumatic.  Right Ear: Hearing normal.  Left Ear: Hearing normal.  Nose: Nose normal.  Eyes: Conjunctivae and lids are normal. Right eye exhibits no discharge. Left eye exhibits no discharge. No scleral icterus.  Cardiovascular: Normal rate, regular rhythm, normal heart sounds and intact distal pulses. Exam reveals no gallop and no friction rub.  No murmur heard. Pulmonary/Chest: Effort normal and breath sounds normal. No respiratory distress. He has no wheezes. He has no rales. He exhibits no tenderness.  Musculoskeletal: He exhibits tenderness. He exhibits no edema or deformity.  Neurological: He is alert and oriented to person, place, and time.  Skin: Skin is warm, dry and intact. No rash noted. He is not diaphoretic. No erythema. No pallor.  Psychiatric: He has a normal mood and affect. His speech is normal and behavior is normal. Judgment and thought content normal. Cognition and memory are normal.  Neck Exam:    Tenderness to Palpation: yes    Midline cervical spine: no    Paraspinal neck musculature: yes    Trapezius: yes    Sternocleidomastoid: yes     Range of Motion:     Flexion: Decreased    Extension: Decreased    Lateral rotation:  Decreased    Lateral bending: Decreased     Neuro Examination: Upper extremity DTRs normal & symmetric.  Strength and sensation intact.    Special Tests:     Spurling test: positive   Results for orders placed or performed in visit on 10/31/16  CBC with Differential  Result Value Ref Range   WBC 5.1 3.8 - 10.6 K/uL   RBC 4.70 4.40 - 5.90 MIL/uL   Hemoglobin 14.3 13.0 - 18.0 g/dL   HCT 41.9 40.0 - 52.0 %   MCV 89.2 80.0 - 100.0 fL    MCH 30.3 26.0 - 34.0 pg   MCHC 34.0 32.0 - 36.0 g/dL   RDW 13.4 11.5 - 14.5 %   Platelets 140 (L) 150 - 440 K/uL   Neutrophils Relative % 62 %   Neutro Abs 3.2 1.4 - 6.5 K/uL   Lymphocytes Relative 24 %   Lymphs Abs 1.2 1.0 - 3.6 K/uL   Monocytes Relative 11 %   Monocytes Absolute 0.5 0.2 - 1.0 K/uL   Eosinophils Relative 2 %   Eosinophils Absolute 0.1 0 - 0.7 K/uL   Basophils Relative 1 %   Basophils Absolute 0.0 0 - 0.1 K/uL  Comprehensive metabolic panel  Result Value Ref Range   Sodium 138 135 - 145 mmol/L   Potassium 4.2 3.5 - 5.1 mmol/L   Chloride 103 101 - 111 mmol/L   CO2 28 22 - 32 mmol/L   Glucose, Bld 108 (H) 65 - 99 mg/dL   BUN 19 6 - 20 mg/dL   Creatinine, Ser 0.97 0.61 - 1.24 mg/dL   Calcium 9.2 8.9 - 10.3 mg/dL   Total Protein 6.8 6.5 - 8.1 g/dL   Albumin 4.2 3.5 - 5.0 g/dL   AST 35 15 - 41 U/L   ALT 51 17 - 63 U/L   Alkaline Phosphatase 49 38 - 126 U/L   Total Bilirubin 0.2 (L) 0.3 - 1.2 mg/dL   GFR calc non Af Amer >60 >60 mL/min   GFR calc Af Amer >60 >60 mL/min   Anion gap 7 5 - 15  Lactate dehydrogenase  Result Value Ref Range   LDH 138 98 - 192 U/L      Assessment & Plan:   Problem List Items Addressed This Visit    None    Visit Diagnoses    Cervical radiculopathy    -  Primary   Will get x-rays to look for arthritis. Can get back in for OMT pending results. Call with any concerns. Await results. Flexeril sent to his pharmacy.   Relevant Medications   cyclobenzaprine (FLEXERIL) 10 MG tablet   Other Relevant Orders   DG Cervical Spine Complete   DG Shoulder Right       Follow up plan: Return Pending results.Marland Kitchen

## 2017-02-20 ENCOUNTER — Encounter: Payer: Self-pay | Admitting: Family Medicine

## 2017-02-23 ENCOUNTER — Ambulatory Visit
Admission: RE | Admit: 2017-02-23 | Discharge: 2017-02-23 | Disposition: A | Discharge status: Home / Self Care | Payer: 59 | Source: Ambulatory Visit | Attending: Family Medicine | Admitting: Family Medicine

## 2017-02-23 DIAGNOSIS — M4722 Other spondylosis with radiculopathy, cervical region: Secondary | ICD-10-CM | POA: Insufficient documentation

## 2017-02-23 DIAGNOSIS — M5412 Radiculopathy, cervical region: Secondary | ICD-10-CM

## 2017-02-26 ENCOUNTER — Encounter: Payer: Self-pay | Admitting: Family Medicine

## 2017-02-26 DIAGNOSIS — M5412 Radiculopathy, cervical region: Secondary | ICD-10-CM

## 2017-03-16 ENCOUNTER — Other Ambulatory Visit: Payer: Self-pay | Admitting: Student

## 2017-03-16 DIAGNOSIS — M5412 Radiculopathy, cervical region: Secondary | ICD-10-CM

## 2017-03-23 ENCOUNTER — Other Ambulatory Visit: Payer: Self-pay | Admitting: Student

## 2017-03-26 ENCOUNTER — Ambulatory Visit
Admission: RE | Admit: 2017-03-26 | Discharge: 2017-03-26 | Disposition: A | Discharge status: Home / Self Care | Payer: 59 | Source: Ambulatory Visit | Attending: Student | Admitting: Student

## 2017-03-26 ENCOUNTER — Other Ambulatory Visit: Payer: Self-pay | Admitting: Student

## 2017-03-26 DIAGNOSIS — M5412 Radiculopathy, cervical region: Secondary | ICD-10-CM

## 2017-03-26 DIAGNOSIS — Z8249 Family history of ischemic heart disease and other diseases of the circulatory system: Secondary | ICD-10-CM | POA: Diagnosis not present

## 2017-03-26 DIAGNOSIS — M8938 Hypertrophy of bone, other site: Secondary | ICD-10-CM | POA: Diagnosis not present

## 2017-03-26 DIAGNOSIS — M47812 Spondylosis without myelopathy or radiculopathy, cervical region: Secondary | ICD-10-CM | POA: Insufficient documentation

## 2017-03-26 DIAGNOSIS — M4802 Spinal stenosis, cervical region: Secondary | ICD-10-CM | POA: Insufficient documentation

## 2017-03-29 ENCOUNTER — Ambulatory Visit: Payer: 59 | Admitting: Family Medicine

## 2017-04-20 ENCOUNTER — Encounter: Payer: Self-pay | Admitting: Family Medicine

## 2017-04-20 ENCOUNTER — Telehealth: Payer: Self-pay | Admitting: Family Medicine

## 2017-04-20 NOTE — Telephone Encounter (Signed)
Copied from [place] [id number]. Topic: Quick Communication - See Telephone Encounter >> Apr 20, 2017  9:53 AM Ahmed Prima L wrote: CRM for notification. See Telephone encounter for: 04/20/17.  Jared Cowan PT from [facility] in [place] needs clearance to perform dry needling. If its okay without talking to her, then please put in writing and fax to [phone]  [phone]

## 2017-04-20 NOTE — Telephone Encounter (Signed)
Letter written to be faxed.

## 2017-04-25 ENCOUNTER — Encounter: Payer: Self-pay | Admitting: Family Medicine

## 2017-04-26 ENCOUNTER — Encounter: Payer: Self-pay | Admitting: Family Medicine

## 2017-04-26 NOTE — Telephone Encounter (Signed)
Jared Cowan calling back and states she did not receive the fax and would like it faxed again. Please advise.

## 2017-04-26 NOTE — Telephone Encounter (Signed)
Letter faxed again

## 2017-05-11 ENCOUNTER — Encounter: Payer: Self-pay | Admitting: Family Medicine

## 2017-05-11 ENCOUNTER — Other Ambulatory Visit: Payer: Self-pay | Admitting: Family Medicine

## 2017-05-11 MED ORDER — DOXYCYCLINE HYCLATE 100 MG PO TABS: 200.0000 mg | ORAL_TABLET | Freq: Once | ORAL | 0 refills | Status: AC

## 2017-05-24 ENCOUNTER — Encounter: Payer: Self-pay | Admitting: Family Medicine

## 2017-06-05 ENCOUNTER — Encounter: Payer: Self-pay | Admitting: Family Medicine

## 2017-06-05 MED ORDER — DOXYCYCLINE HYCLATE 100 MG PO TABS: 100.0000 mg | ORAL_TABLET | Freq: Two times a day (BID) | ORAL | 0 refills | Status: DC

## 2017-06-05 MED ORDER — AMOXICILLIN-POT CLAVULANATE 875-125 MG PO TABS: 1.0000 | ORAL_TABLET | Freq: Two times a day (BID) | ORAL | 0 refills | Status: DC

## 2017-06-15 ENCOUNTER — Ambulatory Visit (INDEPENDENT_AMBULATORY_CARE_PROVIDER_SITE_OTHER): Payer: 59 | Admitting: Family Medicine

## 2017-06-15 ENCOUNTER — Encounter: Payer: Self-pay | Admitting: Family Medicine

## 2017-06-15 VITALS — BP 117/75 | HR 70 | Temp 98.2°F | Ht 67.6 in | Wt 233.4 lb

## 2017-06-15 DIAGNOSIS — E782 Mixed hyperlipidemia: Secondary | ICD-10-CM

## 2017-06-15 DIAGNOSIS — M5412 Radiculopathy, cervical region: Secondary | ICD-10-CM | POA: Diagnosis not present

## 2017-06-15 DIAGNOSIS — C8303 Small cell B-cell lymphoma, intra-abdominal lymph nodes: Secondary | ICD-10-CM

## 2017-06-15 DIAGNOSIS — C8580 Other specified types of non-Hodgkin lymphoma, unspecified site: Secondary | ICD-10-CM

## 2017-06-15 DIAGNOSIS — Z0001 Encounter for general adult medical examination with abnormal findings: Secondary | ICD-10-CM

## 2017-06-15 DIAGNOSIS — I10 Essential (primary) hypertension: Secondary | ICD-10-CM | POA: Diagnosis not present

## 2017-06-15 DIAGNOSIS — Z0000 Encounter for general adult medical examination without abnormal findings: Secondary | ICD-10-CM

## 2017-06-15 DIAGNOSIS — B379 Candidiasis, unspecified: Secondary | ICD-10-CM | POA: Diagnosis not present

## 2017-06-15 DIAGNOSIS — M25511 Pain in right shoulder: Secondary | ICD-10-CM

## 2017-06-15 LAB — MICROALBUMIN, URINE WAIVED
Creatinine, Urine Waived: 100 mg/dL (ref 10–300)
Microalb, Ur Waived: 10 mg/L (ref 0–19)
Microalb/Creat Ratio: <30 mg/g (ref <30)

## 2017-06-15 LAB — UA/M W/RFLX CULTURE, ROUTINE
BILIRUBIN UA: NEGATIVE
Glucose, UA: NEGATIVE
KETONES UA: NEGATIVE
LEUKOCYTES UA: NEGATIVE
NITRITE UA: NEGATIVE
Protein, UA: NEGATIVE
SPEC GRAV UA: 1.01 (ref 1.005–1.030)
UUROB: 0.2 mg/dL (ref 0.2–1.0)
pH, UA: 6 (ref 5.0–7.5)

## 2017-06-15 LAB — MICROSCOPIC EXAMINATION
Bacteria, UA: NONE SEEN
WBC UA: NONE SEEN /HPF (ref 0–5)

## 2017-06-15 MED ORDER — BENAZEPRIL HCL 20 MG PO TABS: ORAL_TABLET | ORAL | 1 refills | Status: AC

## 2017-06-15 MED ORDER — ROSUVASTATIN CALCIUM 20 MG PO TABS: 20.0000 mg | ORAL_TABLET | Freq: Every day | ORAL | 1 refills | Status: AC

## 2017-06-15 MED ORDER — HYDROCHLOROTHIAZIDE 25 MG PO TABS: 25.0000 mg | ORAL_TABLET | Freq: Every day | ORAL | 1 refills | Status: AC

## 2017-06-15 MED ORDER — NYSTATIN 100000 UNIT/ML MT SUSP: 5.0000 mL | Freq: Four times a day (QID) | OROMUCOSAL | 3 refills | Status: DC

## 2017-06-15 MED ORDER — NYSTATIN 100000 UNIT/GM EX POWD: Freq: Four times a day (QID) | CUTANEOUS | 3 refills | Status: DC

## 2017-06-15 NOTE — Progress Notes (Signed)
BP 117/75 (BP Location: Left Arm, Patient Position: Sitting, Cuff Size: Large)   Pulse 70   Temp 98.2 F (36.8 C)   Ht 5' 7.6" (1.717 m)   Wt 233 lb 6 oz (105.9 kg)   SpO2 97%   BMI 35.91 kg/m    Subjective:    Patient ID: Jared Cowan, male    DOB: 04-15-1955, 62 y.o.   MRN: [id number]  HPI: Jared Cowan is a 62 y.o. male presenting on 06/15/2017 for comprehensive medical examination. Current medical complaints include:  SHOULDER PAIN- was cliff jumping and fell 40+ft and landed on his R side- did not have any follow ups, did not go to the ER at that time, but then saw ortho- had a cortisone shot in his shoulder, had done PT with his shoulder Duration: months Involved shoulder: right Mechanism of injury: trauma Location: between the bicep and tricep Onset:sudden Severity: 1/10  Quality:  Dull aching Frequency: mainly at night when he's laying in bed Radiation: no Aggravating factors: sleep  Alleviating factors: stretches, duex   Status: better Treatments attempted: rest, ice, heat, APAP, ibuprofen, aleve, physical therapy and HEP  Relief with NSAIDs?:  moderate Weakness: yes Numbness: yes Decreased grip strength: unsure Redness: no Swelling: no Bruising: no Fevers: no  HYPERTENSION / HYPERLIPIDEMIA Satisfied with current treatment? yes Duration of hypertension: chronic BP monitoring frequency: not checking BP range:  BP medication side effects: no Past BP meds: benazepril, hctz Duration of hyperlipidemia: chronic Cholesterol medication side effects: no Cholesterol supplements: fish oil Past cholesterol medications: crestor Medication compliance: excellent compliance Aspirin: no Recent stressors: no Recurrent headaches: no Visual changes: no Palpitations: no Dyspnea: no Chest pain: no Lower extremity edema: no Dizzy/lightheaded: no  RASH Duration:  weeks  Location: legs and groin  Itching: yes Burning: no Redness: yes Oozing: no Scaling:  yes Blisters: yes Painful: no Fevers: no Change in detergents/soaps/personal care products: no Recent illness: yes Recent travel:no History of same: no Context: better Alleviating factors: lotion/moisturizer Treatments attempted:lotion/moisturizer Shortness of breath: no  Throat/tongue swelling: no Myalgias/arthralgias: yes  He currently lives with: wife Interim Problems from his last visit: no  Depression Screen done today and results listed below:  Depression screen [facility] 2/9 09/25/2016  Decreased Interest 0  Down, Depressed, Hopeless 0  PHQ - 2 Score 0    Past Medical History:  Past Medical History:  Diagnosis Date  . 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 Fracture    left arm  . 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 Hemorrhoids   . Hyperlipidemia   . Hypertension   . Low testosterone   . Peripheral vascular disease ([place])   . Plantar fasciitis of left foot   . Precancerous skin lesion    Eye  . Sleep apnea    CPAP  . Wears dentures    full upper, partial lower    Surgical History:  Past Surgical History:  Procedure Laterality Date  . APPENDECTOMY    . COLONOSCOPY WITH PROPOFOL N/A 12/13/2016   Procedure: COLONOSCOPY WITH PROPOFOL;  Surgeon: Lin Landsman, MD;  Location: [place];  Service: Endoscopy;  Laterality: N/A;  sleep apnea  . [place]  . LASER OF PROSTATE W/ GREEN LIGHT PVP  2014  . LEFT HEART CATH AND CORONARY ANGIOGRAPHY Left 03/21/2016   Procedure: Left Heart Cath and Coronary Angiography;  Surgeon: Yolonda Kida, MD;  Location: [facility]  INVASIVE CV LAB;  Service: Cardiovascular;  Laterality: Left;  . LYMPH NODE BIOPSY Left 03/2014   cancerous  . PROSTATE SURGERY    . SKIN BIOPSY     Eyelid  . TONSILLECTOMY    . urethra and bladder unblocked  2012    Medications:  Current Outpatient Medications on File Prior to Visit  Medication Sig  . Testosterone (ANDROGEL PUMP) 20.25 MG/ACT (1.62%)  GEL Apply topically.  Marland Kitchen CIALIS 20 MG tablet Take 20 mg by mouth daily as needed for erectile dysfunction.   . Ibuprofen-Famotidine 800-26.6 MG TABS Take 1 tablet by mouth daily.  . Multiple Vitamin (MULTIVITAMIN WITH MINERALS) TABS tablet Take 1 tablet by mouth daily.  . Omega-3 Fatty Acids (EQL OMEGA 3 FISH OIL) 1200 MG CPDR Take 1 capsule by mouth daily. Also includes 360mg  of Omega 3  . Vitamins/Minerals TABS Take by mouth.   No current facility-administered medications on file prior to visit.     Allergies:  Allergies  Allergen Reactions  . Adhesive [Tape] Rash    Bandaids after long term use    Social History:  Social History   Socioeconomic History  . Marital status: Married    Spouse name: Not on file  . Number of children: Not on file  . Years of education: Not on file  . Highest education level: Not on file  Occupational History  . Not on file  Social Needs  . Financial resource strain: Not on file  . Food insecurity:    Worry: Not on file    Inability: Not on file  . Transportation needs:    Medical: Not on file    Non-medical: Not on file  Tobacco Use  . Smoking status: Former Smoker    Last attempt to quit: 01/31/1996    Years since quitting: 21.3  . Smokeless tobacco: Never Used  Substance and Sexual Activity  . Alcohol use: Yes    Alcohol/week: 8.4 oz    Types: 14 Shots of liquor per week  . Drug use: No  . Sexual activity: Not Currently  Lifestyle  . Physical activity:    Days per week: Not on file    Minutes per session: Not on file  . Stress: Not on file  Relationships  . Social connections:    Talks on phone: Not on file    Gets together: Not on file    Attends religious service: Not on file    Active member of club or organization: Not on file    Attends meetings of clubs or organizations: Not on file    Relationship status: Not on file  . Intimate partner violence:    Fear of current or ex partner: Not on file    Emotionally abused: Not  on file    Physically abused: Not on file    Forced sexual activity: Not on file  Other Topics Concern  . Not on file  Social History Narrative  . Not on file   Social History   Tobacco Use  Smoking Status Former Smoker  . Last attempt to quit: 01/31/1996  . Years since quitting: 21.3  Smokeless Tobacco Never Used   Social History   Substance and Sexual Activity  Alcohol Use Yes  . Alcohol/week: 8.4 oz  . Types: 14 Shots of liquor per week    Family History:  Family History  Problem Relation Age of Onset  . Dementia Mother 30  . Hypertension Mother   . Hypertension Father   .  Heart attack Father   . Heart disease Father   . Cancer Sister        breast  . Cancer Sister        breast  . Early death Paternal Grandfather   . Heart disease Paternal Grandfather     Past medical history, surgical history, medications, allergies, family history and social history reviewed with patient today and changes made to appropriate areas of the chart.   Review of Systems  Constitutional: Positive for malaise/fatigue and weight loss. Negative for chills, diaphoresis and fever.  HENT: Positive for hearing loss. Negative for congestion, ear discharge, ear pain, nosebleeds, sinus pain, sore throat and tinnitus.   Eyes: Negative.   Respiratory: Positive for cough (when he's riding). Negative for hemoptysis, sputum production, shortness of breath, wheezing and stridor.   Cardiovascular: Negative.   Gastrointestinal: Positive for diarrhea (had a bout of diarrhea in March- better now). Negative for abdominal pain, blood in stool, constipation, heartburn, melena, nausea and vomiting.  Genitourinary: Negative.   Musculoskeletal: Positive for falls, joint pain, myalgias and neck pain. Negative for back pain.  Skin: Positive for rash. Negative for itching.  Neurological: Negative.   Endo/Heme/Allergies: Positive for environmental allergies. Negative for polydipsia. Does not bruise/bleed easily.    Psychiatric/Behavioral: Negative for depression, hallucinations, memory loss, substance abuse and suicidal ideas. The patient is nervous/anxious. The patient does not have insomnia.     All other ROS negative except what is listed above and in the HPI.      Objective:    BP 117/75 (BP Location: Left Arm, Patient Position: Sitting, Cuff Size: Large)   Pulse 70   Temp 98.2 F (36.8 C)   Ht 5' 7.6" (1.717 m)   Wt 233 lb 6 oz (105.9 kg)   SpO2 97%   BMI 35.91 kg/m   Wt Readings from Last 3 Encounters:  06/15/17 233 lb 6 oz (105.9 kg)  02/15/17 237 lb (107.5 kg)  12/13/16 225 lb (102.1 kg)    Physical Exam  Constitutional: He is oriented to person, place, and time. He appears well-developed and well-nourished. No distress.  HENT:  Head: Normocephalic and atraumatic.  Right Ear: Hearing, tympanic membrane, external ear and ear canal normal.  Left Ear: Hearing, tympanic membrane, external ear and ear canal normal.  Nose: Nose normal.  Mouth/Throat: Uvula is midline, oropharynx is clear and moist and mucous membranes are normal. No oropharyngeal exudate.  Eyes: Pupils are equal, round, and reactive to light. Conjunctivae, EOM and lids are normal. Right eye exhibits no discharge. Left eye exhibits no discharge. No scleral icterus.  Neck: Normal range of motion. Neck supple. No JVD present. No tracheal deviation present. No thyromegaly present.  Cardiovascular: Normal rate, regular rhythm, normal heart sounds and intact distal pulses. Exam reveals no gallop and no friction rub.  No murmur heard. Pulmonary/Chest: Effort normal and breath sounds normal. No stridor. No respiratory distress. He has no wheezes. He has no rales. He exhibits no tenderness.  Abdominal: Soft. Bowel sounds are normal. He exhibits no distension and no mass. There is no tenderness. There is no rebound and no guarding. No hernia.  Genitourinary:  Genitourinary Comments: Penis and prostate exams deferred- done at  urology  Musculoskeletal: He exhibits no edema, tenderness or deformity.  Decreased ROM of R shoulder with fascial drag  Lymphadenopathy:    He has no cervical adenopathy.  Neurological: He is alert and oriented to person, place, and time. He displays normal reflexes. No cranial  nerve deficit or sensory deficit. He exhibits normal muscle tone. Coordination normal.  Skin: Skin is warm, dry and intact. Capillary refill takes less than 2 seconds. Rash (erythematous yeasty rash on the inside of his legs) noted. He is not diaphoretic. No erythema. No pallor.  Psychiatric: He has a normal mood and affect. His speech is normal and behavior is normal. Judgment and thought content normal. Cognition and memory are normal.  Nursing note and vitals reviewed.   Results for orders placed or performed in visit on 06/15/17  Microscopic Examination  Result Value Ref Range   WBC, UA None seen 0 - 5 /hpf   RBC, UA 0-2 0 - 2 /hpf   Epithelial Cells (non renal) 0-10 0 - 10 /hpf   Renal Epithel, UA 0-10 (A) None seen /hpf   Bacteria, UA None seen None seen/Few  Microalbumin, Urine Waived  Result Value Ref Range   Microalb, Ur Waived 10 0 - 19 mg/L   Creatinine, Urine Waived 100 10 - 300 mg/dL   Microalb/Creat Ratio <30 <30 mg/g  UA/M w/rflx Culture, Routine  Result Value Ref Range   Specific Gravity, UA 1.010 1.005 - 1.030   pH, UA 6.0 5.0 - 7.5   Color, UA Yellow Yellow   Appearance Ur Clear Clear   Leukocytes, UA Negative Negative   Protein, UA Negative Negative/Trace   Glucose, UA Negative Negative   Ketones, UA Negative Negative   RBC, UA Trace (A) Negative   Bilirubin, UA Negative Negative   Urobilinogen, Ur 0.2 0.2 - 1.0 mg/dL   Nitrite, UA Negative Negative   Microscopic Examination See below:       Assessment & Plan:   Problem List Items Addressed This Visit      Cardiovascular and Mediastinum   Essential hypertension    Under good control. Continue to monitor. Call with any  concerns. Refills given.       Relevant Medications   rosuvastatin (CRESTOR) 20 MG tablet   hydrochlorothiazide (HYDRODIURIL) 25 MG tablet   benazepril (LOTENSIN) 20 MG tablet   Other Relevant Orders   CBC with Differential/Platelet   Comprehensive metabolic panel   Microalbumin, Urine Waived (Completed)   TSH   UA/M w/rflx Culture, Routine (Completed)   VITAMIN D 25 Hydroxy (Vit-D Deficiency, Fractures)     Other   Small cell B-cell lymphoma (HCC)    Stable. Continue to follow with oncology. Call with any concerns.       Relevant Orders   CBC with Differential/Platelet   Comprehensive metabolic panel   TSH   UA/M w/rflx Culture, Routine (Completed)   VITAMIN D 25 Hydroxy (Vit-D Deficiency, Fractures)   Hyperlipidemia    Under good control. Continue to monitor. Call with any concerns. Refills given.       Relevant Medications   rosuvastatin (CRESTOR) 20 MG tablet   hydrochlorothiazide (HYDRODIURIL) 25 MG tablet   benazepril (LOTENSIN) 20 MG tablet   Other Relevant Orders   CBC with Differential/Platelet   Comprehensive metabolic panel   Lipid Panel w/o Chol/HDL Ratio   TSH   UA/M w/rflx Culture, Routine (Completed)   VITAMIN D 25 Hydroxy (Vit-D Deficiency, Fractures)   Lymphosarcoma, mixed cell type (HCC)    Under good control. Continue to monitor. Call with any concerns. Refills given.       Relevant Orders   CBC with Differential/Platelet   Comprehensive metabolic panel   TSH   UA/M w/rflx Culture, Routine (Completed)   VITAMIN D 25 Hydroxy (  Vit-D Deficiency, Fractures)    Other Visit Diagnoses    Routine general medical examination at a health care facility    -  Primary   Vaccines up to date. Screening labs checked today. Continue diet and exercise. Call with any concerns.    Relevant Orders   CBC with Differential/Platelet   Comprehensive metabolic panel   TSH   UA/M w/rflx Culture, Routine (Completed)   VITAMIN D 25 Hydroxy (Vit-D Deficiency,  Fractures)   Cervical radiculopathy       Follow up with neurosurgery as needed. Call with any concerns   Relevant Orders   CBC with Differential/Platelet   Comprehensive metabolic panel   TSH   UA/M w/rflx Culture, Routine (Completed)   VITAMIN D 25 Hydroxy (Vit-D Deficiency, Fractures)   Candida infection       Will treat with nystatin. Call with any concerns.    Relevant Medications   nystatin (MYCOSTATIN) 100000 UNIT/ML suspension   nystatin (MYCOSTATIN/NYSTOP) powder   Acute pain of right shoulder       Will get him back for OMT evaluation. Call with any concerns.       LABORATORY TESTING:  Health maintenance labs ordered today as discussed above.   IMMUNIZATIONS:   - Tdap: Tetanus vaccination status reviewed: last tetanus booster within 10 years. - Influenza: Up to date - Pneumovax: Up to date - Prevnar: Not applicable - HPV: Not applicable - Zostavax vaccine: Up to date  SCREENING: - Colonoscopy: Up to date  Discussed with patient purpose of the colonoscopy is to detect colon cancer at curable precancerous or early stages   PATIENT COUNSELING:    Sexuality: Discussed sexually transmitted diseases, partner selection, use of condoms, avoidance of unintended pregnancy  and contraceptive alternatives.   Advised to avoid cigarette smoking.  I discussed with the patient that most people either abstain from alcohol or drink within safe limits (<=14/week and <=4 drinks/occasion for males, <=7/weeks and <= 3 drinks/occasion for females) and that the risk for alcohol disorders and other health effects rises proportionally with the number of drinks per week and how often a drinker exceeds daily limits.  Discussed cessation/primary prevention of drug use and availability of treatment for abuse.   Diet: Encouraged to adjust caloric intake to maintain  or achieve ideal body weight, to reduce intake of dietary saturated fat and total fat, to limit sodium intake by avoiding high  sodium foods and not adding table salt, and to maintain adequate dietary potassium and calcium preferably from fresh fruits, vegetables, and low-fat dairy products.    stressed the importance of regular exercise  Injury prevention: Discussed safety belts, safety helmets, smoke detector, smoking near bedding or upholstery.   Dental health: Discussed importance of regular tooth brushing, flossing, and dental visits.   Follow up plan: NEXT PREVENTATIVE PHYSICAL DUE IN 1 YEAR. Return if symptoms worsen or fail to improve.

## 2017-06-15 NOTE — Assessment & Plan Note (Signed)
Under good control. Continue to monitor. Call with any concerns. Refills given.  

## 2017-06-15 NOTE — Assessment & Plan Note (Signed)
Stable. Continue to follow with oncology. Call with any concerns.  ?

## 2017-06-15 NOTE — Patient Instructions (Signed)

## 2017-06-16 LAB — COMPREHENSIVE METABOLIC PANEL
ALK PHOS: 57 IU/L (ref 39–117)
ALT: 38 IU/L (ref 0–44)
AST: 28 IU/L (ref 0–40)
Albumin/Globulin Ratio: 2 (ref 1.2–2.2)
Albumin: 4.7 g/dL (ref 3.6–4.8)
BUN/Creatinine Ratio: 17 (ref 10–24)
BUN: 19 mg/dL (ref 8–27)
Bilirubin Total: 0.9 mg/dL (ref 0.0–1.2)
CALCIUM: 10.2 mg/dL (ref 8.6–10.2)
CO2: 23 mmol/L (ref 20–29)
CREATININE: 1.11 mg/dL (ref 0.76–1.27)
Chloride: 99 mmol/L (ref 96–106)
GFR calc Af Amer: 82 mL/min/{1.73_m2} (ref >59)
GFR, EST NON AFRICAN AMERICAN: 71 mL/min/{1.73_m2} (ref >59)
GLOBULIN, TOTAL: 2.3 g/dL (ref 1.5–4.5)
GLUCOSE: 78 mg/dL (ref 65–99)
Potassium: 4.1 mmol/L (ref 3.5–5.2)
SODIUM: 139 mmol/L (ref 134–144)
Total Protein: 7 g/dL (ref 6.0–8.5)

## 2017-06-16 LAB — LIPID PANEL W/O CHOL/HDL RATIO
CHOLESTEROL TOTAL: 154 mg/dL (ref 100–199)
HDL: 39 mg/dL — ABNORMAL LOW (ref >39)
LDL CALC: 98 mg/dL (ref 0–99)
Triglycerides: 84 mg/dL (ref 0–149)
VLDL Cholesterol Cal: 17 mg/dL (ref 5–40)

## 2017-06-16 LAB — TSH: TSH: 2.49 u[IU]/mL (ref 0.450–4.500)

## 2017-06-16 LAB — CBC WITH DIFFERENTIAL/PLATELET
BASOS ABS: 0 10*3/uL (ref 0.0–0.2)
Basos: 1 %
EOS (ABSOLUTE): 0.1 10*3/uL (ref 0.0–0.4)
Eos: 1 %
HEMOGLOBIN: 17.6 g/dL (ref 13.0–17.7)
Hematocrit: 50.7 % (ref 37.5–51.0)
IMMATURE GRANULOCYTES: 0 %
Immature Grans (Abs): 0 10*3/uL (ref 0.0–0.1)
LYMPHS: 25 %
Lymphocytes Absolute: 1.6 10*3/uL (ref 0.7–3.1)
MCH: 31.4 pg (ref 26.6–33.0)
MCHC: 34.7 g/dL (ref 31.5–35.7)
MCV: 90 fL (ref 79–97)
MONOCYTES: 8 %
Monocytes Absolute: 0.5 10*3/uL (ref 0.1–0.9)
NEUTROS PCT: 65 %
Neutrophils Absolute: 4.1 10*3/uL (ref 1.4–7.0)
Platelets: 167 10*3/uL (ref 150–379)
RBC: 5.61 x10E6/uL (ref 4.14–5.80)
RDW: 13.9 % (ref 12.3–15.4)
WBC: 6.4 10*3/uL (ref 3.4–10.8)

## 2017-06-16 LAB — VITAMIN D 25 HYDROXY (VIT D DEFICIENCY, FRACTURES): Vit D, 25-Hydroxy: 62.1 ng/mL (ref 30.0–100.0)

## 2017-07-06 ENCOUNTER — Encounter: Payer: Self-pay | Admitting: Family Medicine

## 2017-07-06 ENCOUNTER — Ambulatory Visit (INDEPENDENT_AMBULATORY_CARE_PROVIDER_SITE_OTHER): Payer: 59 | Admitting: Family Medicine

## 2017-07-06 VITALS — BP 125/74 | HR 59 | Temp 98.1°F | Wt 234.4 lb

## 2017-07-06 DIAGNOSIS — M9909 Segmental and somatic dysfunction of abdomen and other regions: Secondary | ICD-10-CM | POA: Diagnosis not present

## 2017-07-06 DIAGNOSIS — M9902 Segmental and somatic dysfunction of thoracic region: Secondary | ICD-10-CM | POA: Diagnosis not present

## 2017-07-06 DIAGNOSIS — M9900 Segmental and somatic dysfunction of head region: Secondary | ICD-10-CM | POA: Diagnosis not present

## 2017-07-06 DIAGNOSIS — M25511 Pain in right shoulder: Secondary | ICD-10-CM | POA: Diagnosis not present

## 2017-07-06 DIAGNOSIS — M9908 Segmental and somatic dysfunction of rib cage: Secondary | ICD-10-CM

## 2017-07-06 DIAGNOSIS — M9901 Segmental and somatic dysfunction of cervical region: Secondary | ICD-10-CM

## 2017-07-06 DIAGNOSIS — M9907 Segmental and somatic dysfunction of upper extremity: Secondary | ICD-10-CM
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
## 2017-07-08 ENCOUNTER — Encounter: Payer: Self-pay | Admitting: Family Medicine

## 2017-07-27 ENCOUNTER — Ambulatory Visit: Payer: 59 | Admitting: Family Medicine

## 2017-07-30 ENCOUNTER — Encounter: Payer: Self-pay | Admitting: Family Medicine

## 2017-10-03 ENCOUNTER — Encounter: Payer: Self-pay | Admitting: Family Medicine

## 2017-10-03 ENCOUNTER — Ambulatory Visit (INDEPENDENT_AMBULATORY_CARE_PROVIDER_SITE_OTHER): Payer: 59 | Admitting: Family Medicine

## 2017-10-03 ENCOUNTER — Other Ambulatory Visit: Payer: Self-pay

## 2017-10-03 VITALS — BP 135/82 | HR 67 | Temp 98.0°F | Ht 67.6 in | Wt 224.3 lb

## 2017-10-03 DIAGNOSIS — Z23 Encounter for immunization: Secondary | ICD-10-CM | POA: Diagnosis not present

## 2017-10-03 DIAGNOSIS — M25511 Pain in right shoulder: Secondary | ICD-10-CM | POA: Diagnosis not present

## 2017-10-03 DIAGNOSIS — M9901 Segmental and somatic dysfunction of cervical region: Secondary | ICD-10-CM

## 2017-10-03 DIAGNOSIS — M9900 Segmental and somatic dysfunction of head region: Secondary | ICD-10-CM

## 2017-10-03 DIAGNOSIS — M9904 Segmental and somatic dysfunction of sacral region: Secondary | ICD-10-CM

## 2017-10-03 DIAGNOSIS — M9905 Segmental and somatic dysfunction of pelvic region: Secondary | ICD-10-CM

## 2017-10-03 DIAGNOSIS — M9902 Segmental and somatic dysfunction of thoracic region: Secondary | ICD-10-CM

## 2017-10-03 DIAGNOSIS — M9907 Segmental and somatic dysfunction of upper extremity: Secondary | ICD-10-CM

## 2017-10-03 NOTE — Progress Notes (Signed)
BP 135/82   Pulse 67   Temp 98 F (36.7 C) (Oral)   Ht 5' 7.6" (1.717 m)   Wt 224 lb 4.8 oz (101.7 kg)   SpO2 95%   BMI 34.51 kg/m    Subjective:    Patient ID: Jared Cowan, male    DOB: 1955-10-17, 62 y.o.   MRN: [id number]  HPI: Jared Cowan is a 62 y.o. male  Chief Complaint  Patient presents with  . Shoulder Pain    right side   Kiko presents today for evaluation of his R shoulder pain. He states that it is doing a bit better since his last visit. He has been doing stretching which he finds very helpful. He notes that he hasn't done any stretches in a couple of days and had been feeling really tight in the bicep. He states that his last treatment really helped and made him feel a lot better for several weeks. He felt like he was continuing to doing well following his last treatment, but is going to have a knee replacement and would like to be treated before that. Pain is shooting and sharp. No radiation. Nothing in the lower arm. Better with stretching and OMT. Unsure what makes it worse. Otherwise feeling well with no other concerns or complaints at this time.   Relevant past medical, surgical, family and social history reviewed and updated as indicated. Interim medical history since our last visit reviewed. Allergies and medications reviewed and updated.  Review of Systems  Constitutional: Negative.   Respiratory: Negative.   Cardiovascular: Negative.   Musculoskeletal: Positive for arthralgias and myalgias. Negative for back pain, gait problem, joint swelling, neck pain and neck stiffness.  Skin: Negative.   Neurological: Negative.   Psychiatric/Behavioral: Negative.     Per HPI unless specifically indicated above     Objective:    BP 135/82   Pulse 67   Temp 98 F (36.7 C) (Oral)   Ht 5' 7.6" (1.717 m)   Wt 224 lb 4.8 oz (101.7 kg)   SpO2 95%   BMI 34.51 kg/m   Wt Readings from Last 3 Encounters:  10/03/17 224 lb 4.8 oz (101.7 kg)  07/06/17  234 lb 6 oz (106.3 kg)  06/15/17 233 lb 6 oz (105.9 kg)    Physical Exam  Constitutional: He is oriented to person, place, and time. He appears well-developed and well-nourished. No distress.  HENT:  Head: Normocephalic and atraumatic.  Right Ear: Hearing normal.  Left Ear: Hearing normal.  Nose: Nose normal.  Eyes: Conjunctivae and lids are normal. Right eye exhibits no discharge. Left eye exhibits no discharge. No scleral icterus.  Cardiovascular: Normal rate, regular rhythm and intact distal pulses.  Pulmonary/Chest: Effort normal and breath sounds normal. No respiratory distress.  Abdominal: Soft. He exhibits no distension and no mass. There is no tenderness. There is no rebound and no guarding. No hernia.  Neurological: He is alert and oriented to person, place, and time.  Skin: Skin is warm, dry and intact. Capillary refill takes less than 2 seconds. No rash noted. He is not diaphoretic. No erythema. No pallor.  Psychiatric: He has a normal mood and affect. His speech is normal and behavior is normal. Judgment and thought content normal. Cognition and memory are normal.  Nursing note and vitals reviewed. Musculoskeletal:  Exam found Decreased ROM, Tissue texture changes, Tenderness to palpation and Asymmetry of patient's  head, neck, thorax, pelvis, sacrum and upper extremity Osteopathic Structural Exam:  Head: hypertonic suboccipital muscles, OAESSR  Neck: Trap spasm on the R, C3ESRR  Thorax: trap spasm on the R,   Pelvis: Posterior R innominate  Sacrum: R on R torsion  Upper Extremity: pec hypertonicity on the R, biceps hypertonicity on the R with fascial strain from biceps into the R pec  Results for orders placed or performed in visit on 06/15/17  Microscopic Examination  Result Value Ref Range   WBC, UA None seen 0 - 5 /hpf   RBC, UA 0-2 0 - 2 /hpf   Epithelial Cells (non renal) 0-10 0 - 10 /hpf   Renal Epithel, UA 0-10 (A) None seen /hpf   Bacteria, UA None seen None  seen/Few  CBC with Differential/Platelet  Result Value Ref Range   WBC 6.4 3.4 - 10.8 x10E3/uL   RBC 5.61 4.14 - 5.80 x10E6/uL   Hemoglobin 17.6 13.0 - 17.7 g/dL   Hematocrit 50.7 37.5 - 51.0 %   MCV 90 79 - 97 fL   MCH 31.4 26.6 - 33.0 pg   MCHC 34.7 31.5 - 35.7 g/dL   RDW 13.9 12.3 - 15.4 %   Platelets 167 150 - 379 x10E3/uL   Neutrophils 65 Not Estab. %   Lymphs 25 Not Estab. %   Monocytes 8 Not Estab. %   Eos 1 Not Estab. %   Basos 1 Not Estab. %   Neutrophils Absolute 4.1 1.4 - 7.0 x10E3/uL   Lymphocytes Absolute 1.6 0.7 - 3.1 x10E3/uL   Monocytes Absolute 0.5 0.1 - 0.9 x10E3/uL   EOS (ABSOLUTE) 0.1 0.0 - 0.4 x10E3/uL   Basophils Absolute 0.0 0.0 - 0.2 x10E3/uL   Immature Granulocytes 0 Not Estab. %   Immature Grans (Abs) 0.0 0.0 - 0.1 x10E3/uL  Comprehensive metabolic panel  Result Value Ref Range   Glucose 78 65 - 99 mg/dL   BUN 19 8 - 27 mg/dL   Creatinine, Ser 1.11 0.76 - 1.27 mg/dL   GFR calc non Af Amer 71 >59 mL/min/1.73   GFR calc Af Amer 82 >59 mL/min/1.73   BUN/Creatinine Ratio 17 10 - 24   Sodium 139 134 - 144 mmol/L   Potassium 4.1 3.5 - 5.2 mmol/L   Chloride 99 96 - 106 mmol/L   CO2 23 20 - 29 mmol/L   Calcium 10.2 8.6 - 10.2 mg/dL   Total Protein 7.0 6.0 - 8.5 g/dL   Albumin 4.7 3.6 - 4.8 g/dL   Globulin, Total 2.3 1.5 - 4.5 g/dL   Albumin/Globulin Ratio 2.0 1.2 - 2.2   Bilirubin Total 0.9 0.0 - 1.2 mg/dL   Alkaline Phosphatase 57 39 - 117 IU/L   AST 28 0 - 40 IU/L   ALT 38 0 - 44 IU/L  Lipid Panel w/o Chol/HDL Ratio  Result Value Ref Range   Cholesterol, Total 154 100 - 199 mg/dL   Triglycerides 84 0 - 149 mg/dL   HDL 39 (L) >39 mg/dL   VLDL Cholesterol Cal 17 5 - 40 mg/dL   LDL Calculated 98 0 - 99 mg/dL  Microalbumin, Urine Waived  Result Value Ref Range   Microalb, Ur Waived 10 0 - 19 mg/L   Creatinine, Urine Waived 100 10 - 300 mg/dL   Microalb/Creat Ratio <30 <30 mg/g  TSH  Result Value Ref Range   TSH 2.490 0.450 - 4.500 uIU/mL    UA/M w/rflx Culture, Routine  Result Value Ref Range   Specific Gravity, UA 1.010 1.005 - 1.030   pH, UA  6.0 5.0 - 7.5   Color, UA Yellow Yellow   Appearance Ur Clear Clear   Leukocytes, UA Negative Negative   Protein, UA Negative Negative/Trace   Glucose, UA Negative Negative   Ketones, UA Negative Negative   RBC, UA Trace (A) Negative   Bilirubin, UA Negative Negative   Urobilinogen, Ur 0.2 0.2 - 1.0 mg/dL   Nitrite, UA Negative Negative   Microscopic Examination See below:   VITAMIN D 25 Hydroxy (Vit-D Deficiency, Fractures)  Result Value Ref Range   Vit D, 25-Hydroxy 62.1 30.0 - 100.0 ng/mL      Assessment & Plan:   Problem List Items Addressed This Visit    None    Visit Diagnoses    Acute pain of right shoulder    -  Primary   Starting to act up again. Appears to be myofascial. I do think he would benefit from OMT. He has somatic dysfunctions contributing to symptoms. treated as below   Flu vaccine need       Flu shot given today.   Relevant Orders   Flu Vaccine QUAD 36+ mos IM (Completed)   Head region somatic dysfunction       Cervical segment dysfunction       Thoracic segment dysfunction       Somatic dysfunction of upper extremities       Somatic dysfunction of sacral region       Somatic dysfunction of pelvis region         After verbal consent was obtained, patient was treated today with osteopathic manipulative medicine to the regions of the head, neck, thorax, pelvis, sacrum and upper extremity using the techniques of FPR, myofascial release, counterstrain, muscle energy and soft tissue. Areas of compensation relating to his primary pain source also treated. Patient tolerated the procedure well with good objective and good subjective improvement in symptoms. He left the room in good condition. He was advised to stay well hydrated and that he may have some soreness following the procedure. If not improving or worsening, he will call and come in. He will return  for reevaluation  on a PRN basis.   Follow up plan: Return if symptoms worsen or fail to improve.

## 2017-10-11 ENCOUNTER — Other Ambulatory Visit: Payer: Self-pay

## 2017-10-11 ENCOUNTER — Ambulatory Visit (INDEPENDENT_AMBULATORY_CARE_PROVIDER_SITE_OTHER): Payer: 59 | Admitting: Family Medicine

## 2017-10-11 ENCOUNTER — Encounter: Payer: Self-pay | Admitting: Family Medicine

## 2017-10-11 VITALS — BP 137/75 | HR 94 | Temp 98.5°F | Ht 67.6 in | Wt 228.0 lb

## 2017-10-11 DIAGNOSIS — M9903 Segmental and somatic dysfunction of lumbar region: Secondary | ICD-10-CM | POA: Diagnosis not present

## 2017-10-11 DIAGNOSIS — M9905 Segmental and somatic dysfunction of pelvic region: Secondary | ICD-10-CM

## 2017-10-11 DIAGNOSIS — M25511 Pain in right shoulder: Secondary | ICD-10-CM

## 2017-10-11 DIAGNOSIS — M9901 Segmental and somatic dysfunction of cervical region: Secondary | ICD-10-CM | POA: Diagnosis not present

## 2017-10-11 DIAGNOSIS — M9908 Segmental and somatic dysfunction of rib cage: Secondary | ICD-10-CM | POA: Diagnosis not present

## 2017-10-11 DIAGNOSIS — M9904 Segmental and somatic dysfunction of sacral region: Secondary | ICD-10-CM

## 2017-10-11 DIAGNOSIS — M9900 Segmental and somatic dysfunction of head region: Secondary | ICD-10-CM | POA: Diagnosis not present

## 2017-10-11 DIAGNOSIS — M5412 Radiculopathy, cervical region: Secondary | ICD-10-CM | POA: Diagnosis not present

## 2017-10-11 DIAGNOSIS — M9902 Segmental and somatic dysfunction of thoracic region: Secondary | ICD-10-CM

## 2017-10-11 NOTE — Progress Notes (Signed)
BP 137/75   Pulse 94   Temp 98.5 F (36.9 C) (Oral)   Ht 5' 7.6" (1.717 m)   Wt 228 lb (103.4 kg)   SpO2 95%   BMI 35.08 kg/m    Subjective:    Patient ID: Jared Cowan, male    DOB: April 07, 1955, 62 y.o.   MRN: [id number]  HPI: Jared Cowan is a 62 y.o. male  Chief Complaint  Patient presents with  . Shoulder Pain    right side  . Back Pain   Ravin states that he has been doing well. He notes that occasionally, he will end up with some shooting pains between his shoulder blades when he wakes up in the AM. He notes that it doesn't move. It gets better with stretching. Nothing makes it worse. It's shooting in nature. He has also been noticing some more popping in his neck. He does not feel pain with it, just noticing that it is making noises. The spot in his bicep is doing OK. Seems to come and go. He is otherwise doing well with no other concerns or complaints at this time.   Relevant past medical, surgical, family and social history reviewed and updated as indicated. Interim medical history since our last visit reviewed. Allergies and medications reviewed and updated.  Review of Systems  Constitutional: Negative.   Respiratory: Negative.   Cardiovascular: Negative.   Musculoskeletal: Positive for arthralgias, myalgias, neck pain and neck stiffness. Negative for back pain, gait problem and joint swelling.  Skin: Negative.   Neurological: Negative.   Psychiatric/Behavioral: Negative.     Per HPI unless specifically indicated above     Objective:    BP 137/75   Pulse 94   Temp 98.5 F (36.9 C) (Oral)   Ht 5' 7.6" (1.717 m)   Wt 228 lb (103.4 kg)   SpO2 95%   BMI 35.08 kg/m   Wt Readings from Last 3 Encounters:  10/11/17 228 lb (103.4 kg)  10/03/17 224 lb 4.8 oz (101.7 kg)  07/06/17 234 lb 6 oz (106.3 kg)    Physical Exam  Constitutional: He is oriented to person, place, and time. He appears well-developed and well-nourished. No distress.  HENT:    Head: Normocephalic and atraumatic.  Right Ear: Hearing normal.  Left Ear: Hearing normal.  Nose: Nose normal.  Eyes: Conjunctivae and lids are normal. Right eye exhibits no discharge. Left eye exhibits no discharge. No scleral icterus.  Pulmonary/Chest: Effort normal. No respiratory distress.  Abdominal: Soft. He exhibits no distension and no mass. There is no tenderness. There is no rebound and no guarding. No hernia.  Neurological: He is alert and oriented to person, place, and time.  Skin: Skin is warm, dry and intact. Capillary refill takes less than 2 seconds. No rash noted. He is not diaphoretic. No erythema. No pallor.  Psychiatric: He has a normal mood and affect. His speech is normal and behavior is normal. Judgment and thought content normal. Cognition and memory are normal.  Nursing note and vitals reviewed. Musculoskeletal:  Exam found Decreased ROM, Tissue texture changes, Tenderness to palpation and Asymmetry of patient's  head, neck, thorax, ribs, lumbar, pelvis and sacrum Osteopathic Structural Exam:   Head: hypertonic suboccipital muscles, OAESSL, L torsion, temporal internally and anterior on the R  Neck: C3ESRR, C4ESRL, SCM hypertonic bilaterally, paraspinals hypertonic bilaterally R>L, trap spasm on the R  Thorax: T1-3SLRR, trap spasm on the R  Ribs: Ribs 8-10 locked up on the L  Lumbar: QL hypertonic on the R, L4-5SRRL  Pelvis: Posterior innominate on the R  Sacrum: R on R torsion on the R  Results for orders placed or performed in visit on 06/15/17  Microscopic Examination  Result Value Ref Range   WBC, UA None seen 0 - 5 /hpf   RBC, UA 0-2 0 - 2 /hpf   Epithelial Cells (non renal) 0-10 0 - 10 /hpf   Renal Epithel, UA 0-10 (A) None seen /hpf   Bacteria, UA None seen None seen/Few  CBC with Differential/Platelet  Result Value Ref Range   WBC 6.4 3.4 - 10.8 x10E3/uL   RBC 5.61 4.14 - 5.80 x10E6/uL   Hemoglobin 17.6 13.0 - 17.7 g/dL   Hematocrit 50.7 37.5 -  51.0 %   MCV 90 79 - 97 fL   MCH 31.4 26.6 - 33.0 pg   MCHC 34.7 31.5 - 35.7 g/dL   RDW 13.9 12.3 - 15.4 %   Platelets 167 150 - 379 x10E3/uL   Neutrophils 65 Not Estab. %   Lymphs 25 Not Estab. %   Monocytes 8 Not Estab. %   Eos 1 Not Estab. %   Basos 1 Not Estab. %   Neutrophils Absolute 4.1 1.4 - 7.0 x10E3/uL   Lymphocytes Absolute 1.6 0.7 - 3.1 x10E3/uL   Monocytes Absolute 0.5 0.1 - 0.9 x10E3/uL   EOS (ABSOLUTE) 0.1 0.0 - 0.4 x10E3/uL   Basophils Absolute 0.0 0.0 - 0.2 x10E3/uL   Immature Granulocytes 0 Not Estab. %   Immature Grans (Abs) 0.0 0.0 - 0.1 x10E3/uL  Comprehensive metabolic panel  Result Value Ref Range   Glucose 78 65 - 99 mg/dL   BUN 19 8 - 27 mg/dL   Creatinine, Ser 1.11 0.76 - 1.27 mg/dL   GFR calc non Af Amer 71 >59 mL/min/1.73   GFR calc Af Amer 82 >59 mL/min/1.73   BUN/Creatinine Ratio 17 10 - 24   Sodium 139 134 - 144 mmol/L   Potassium 4.1 3.5 - 5.2 mmol/L   Chloride 99 96 - 106 mmol/L   CO2 23 20 - 29 mmol/L   Calcium 10.2 8.6 - 10.2 mg/dL   Total Protein 7.0 6.0 - 8.5 g/dL   Albumin 4.7 3.6 - 4.8 g/dL   Globulin, Total 2.3 1.5 - 4.5 g/dL   Albumin/Globulin Ratio 2.0 1.2 - 2.2   Bilirubin Total 0.9 0.0 - 1.2 mg/dL   Alkaline Phosphatase 57 39 - 117 IU/L   AST 28 0 - 40 IU/L   ALT 38 0 - 44 IU/L  Lipid Panel w/o Chol/HDL Ratio  Result Value Ref Range   Cholesterol, Total 154 100 - 199 mg/dL   Triglycerides 84 0 - 149 mg/dL   HDL 39 (L) >39 mg/dL   VLDL Cholesterol Cal 17 5 - 40 mg/dL   LDL Calculated 98 0 - 99 mg/dL  Microalbumin, Urine Waived  Result Value Ref Range   Microalb, Ur Waived 10 0 - 19 mg/L   Creatinine, Urine Waived 100 10 - 300 mg/dL   Microalb/Creat Ratio <30 <30 mg/g  TSH  Result Value Ref Range   TSH 2.490 0.450 - 4.500 uIU/mL  UA/M w/rflx Culture, Routine  Result Value Ref Range   Specific Gravity, UA 1.010 1.005 - 1.030   pH, UA 6.0 5.0 - 7.5   Color, UA Yellow Yellow   Appearance Ur Clear Clear   Leukocytes, UA  Negative Negative   Protein, UA Negative Negative/Trace   Glucose, UA  Negative Negative   Ketones, UA Negative Negative   RBC, UA Trace (A) Negative   Bilirubin, UA Negative Negative   Urobilinogen, Ur 0.2 0.2 - 1.0 mg/dL   Nitrite, UA Negative Negative   Microscopic Examination See below:   VITAMIN D 25 Hydroxy (Vit-D Deficiency, Fractures)  Result Value Ref Range   Vit D, 25-Hydroxy 62.1 30.0 - 100.0 ng/mL      Assessment & Plan:   Problem List Items Addressed This Visit      Nervous and Auditory   Cervical radiculopathy    Discussed that the popping is probably due to his arthritis. If not causing him pain at this time, nothing to worry about. He does have some somatic dysfunction that is likely contributing to his symptoms. Treated today with good results as below.        Other Visit Diagnoses    Acute pain of right shoulder    -  Primary   Seems to be myofascial in nature.  He does have some somatic dysfunction that is likely contributing to his symptoms. Treated today with good results as below.    Head region somatic dysfunction       Cervical segment dysfunction       Thoracic segment dysfunction       Somatic dysfunction of sacral region       Somatic dysfunction of pelvis region       Rib cage region somatic dysfunction       Somatic dysfunction of lumbar region         After verbal consent was obtained, patient was treated today with osteopathic manipulative medicine to the regions of the head, neck, thorax, ribs, lumbar, pelvis and sacrum using the techniques of cranial, myofascial release, counterstrain, muscle energy, HVLA and soft tissue. Areas of compensation relating to his primary pain source also treated. Patient tolerated the procedure well with good objective and good subjective improvement in symptoms. He left the room in good condition. He was advised to stay well hydrated and that he may have some soreness following the procedure. If not improving or  worsening, he will call and come in. He will return for reevaluation  in 1-2 months.   Follow up plan: Return if symptoms worsen or fail to improve.

## 2017-10-15 ENCOUNTER — Encounter: Payer: Self-pay | Admitting: Family Medicine

## 2017-10-15 DIAGNOSIS — M5412 Radiculopathy, cervical region: Secondary | ICD-10-CM | POA: Insufficient documentation

## 2017-10-15 NOTE — Assessment & Plan Note (Signed)
Discussed that the popping is probably due to his arthritis. If not causing him pain at this time, nothing to worry about. He does have some somatic dysfunction that is likely contributing to his symptoms. Treated today with good results as below.

## 2017-11-06 ENCOUNTER — Encounter: Payer: Self-pay | Admitting: Family Medicine

## 2017-11-11 NOTE — Progress Notes (Signed)
[place]  [phone] [phone]  ID: Jared Cowan OB: [birth year]  MR#: [id number]  WNI#:[id number]  Patient Care Team: Valerie Roys, DO as PCP - General (Family Medicine)  CHIEF COMPLAINT: Small lymphocytic lymphoma  INTERVAL HISTORY: Patient returns to clinic today for routine yearly evaluation, laboratory work, and discussion of his imaging results.  He continues to feel well and remains asymptomatic.  He denies any recent fevers, night sweats, or weight loss. He has no neurologic complaints.  He has no chest pain or shortness of breath.  He denies any nausea, vomiting, constipation, or diarrhea.  He has no urinary complaints.  Patient feels at his baseline offers no specific complaints today.  REVIEW OF SYSTEMS:   Review of Systems  Constitutional: Negative.  Negative for diaphoresis, fever, malaise/fatigue and weight loss.  Respiratory: Negative.  Negative for cough and shortness of breath.   Cardiovascular: Negative.  Negative for chest pain and leg swelling.  Gastrointestinal: Negative.  Negative for abdominal pain.  Genitourinary: Negative.  Negative for dysuria and hematuria.  Musculoskeletal: Positive for joint pain.  Neurological: Negative.  Negative for sensory change, focal weakness, weakness and headaches.  Endo/Heme/Allergies: Does not bruise/bleed easily.  Psychiatric/Behavioral: Negative.  The patient is not nervous/anxious.     As per HPI. Otherwise, a complete review of systems is negative.  PAST MEDICAL HISTORY: Past Medical History:  Diagnosis Date  . Cancer ([place]) 03/2014   Small Lymphotic Lymphoma  . Fracture    left arm  . 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 Hemorrhoids   . Hyperlipidemia   . Hypertension   . Low testosterone   . Peripheral vascular disease ([place])   . Plantar fasciitis of left foot   . Precancerous skin lesion    Eye  . Sleep apnea    CPAP  . Wears dentures    full upper,  partial lower    PAST SURGICAL HISTORY: Past Surgical History:  Procedure Laterality Date  . APPENDECTOMY    . COLONOSCOPY WITH PROPOFOL N/A 12/13/2016   Procedure: COLONOSCOPY WITH PROPOFOL;  Surgeon: Lin Landsman, MD;  Location: [facility];  Service: Endoscopy;  Laterality: N/A;  sleep apnea  . [place]  . LASER OF PROSTATE W/ GREEN LIGHT PVP  2014  . LEFT HEART CATH AND CORONARY ANGIOGRAPHY Left 03/21/2016   Procedure: Left Heart Cath and Coronary Angiography;  Surgeon: Yolonda Kida, MD;  Location: [facility];  Service: Cardiovascular;  Laterality: Left;  . LYMPH NODE BIOPSY Left 03/2014   cancerous  . PROSTATE SURGERY    . SKIN BIOPSY     Eyelid  . TONSILLECTOMY    . urethra and bladder unblocked  2012    FAMILY HISTORY Family History  Problem Relation Age of Onset  . Dementia Mother 35  . Hypertension Mother   . Hypertension Father   . Heart attack Father   . Heart disease Father   . Cancer Sister        breast  . Cancer Sister        breast  . Early death Paternal Grandfather   . Heart disease Paternal Grandfather        ADVANCED DIRECTIVES:    HEALTH MAINTENANCE: Social History   Tobacco Use  . Smoking status: Former Smoker    Last attempt to quit: 01/31/1996    Years since quitting: 21.8  . Smokeless tobacco: Never  Used  Substance Use Topics  . Alcohol use: Yes    Alcohol/week: 14.0 standard drinks    Types: 14 Shots of liquor per week  . Drug use: No     Colonoscopy:  PAP:  Bone density:  Lipid panel:  Allergies  Allergen Reactions  . Adhesive [Tape] Rash    Bandaids after long term use    Current Outpatient Medications  Medication Sig Dispense Refill  . benazepril (LOTENSIN) 20 MG tablet TAKE ONE & ONE-HALF TABLETS BY MOUTH ONCE DAILY 135 tablet 1  . hydrochlorothiazide (HYDRODIURIL) 25 MG tablet Take 1 tablet (25 mg total) by mouth daily. 90 tablet 1  . Ibuprofen-Famotidine 800-26.6 MG TABS  Take 1 tablet by mouth daily. 90 tablet   . Multiple Vitamin (MULTIVITAMIN WITH MINERALS) TABS tablet Take 1 tablet by mouth daily.    . Omega-3 Fatty Acids (EQL OMEGA 3 FISH OIL) 1200 MG CPDR Take 1 capsule by mouth daily. Also includes 360mg  of Omega 3 60 capsule   . rosuvastatin (CRESTOR) 20 MG tablet Take 1 tablet (20 mg total) by mouth daily. 90 tablet 1  . Testosterone (ANDROGEL PUMP) 20.25 MG/ACT (1.62%) GEL Apply topically.    . vardenafil (LEVITRA) 20 MG tablet See admin instructions.  5  . Vitamins/Minerals TABS Take by mouth.     No current facility-administered medications for this visit.     OBJECTIVE: Vitals:   11/16/17 1537  BP: (!) 168/98  Pulse: 89  Resp: 20  Temp: (!) 97 F (36.1 C)     Body mass index is 34.84 kg/m.    ECOG FS:0 - Asymptomatic  General: Well-developed, well-nourished, no acute distress. Eyes: Pink conjunctiva, anicteric sclera. HEENT: Normocephalic, moist mucous membranes, clear oropharnyx. Lungs: Clear to auscultation bilaterally. Heart: Regular rate and rhythm. No rubs, murmurs, or gallops. Abdomen: Soft, nontender, nondistended. No organomegaly noted, normoactive bowel sounds. Musculoskeletal: No edema, cyanosis, or clubbing. Neuro: Alert, answering all questions appropriately. Cranial nerves grossly intact. Skin: No rashes or petechiae noted. Psych: Normal affect. Lymphatics: No cervical, calvicular, axillary or inguinal LAD.  LAB RESULTS:  Lab Results  Component Value Date   NA 139 06/15/2017   K 4.1 06/15/2017   CL 99 06/15/2017   CO2 23 06/15/2017   GLUCOSE 78 06/15/2017   BUN 19 06/15/2017   CREATININE 1.16 11/13/2017   CALCIUM 10.2 06/15/2017   PROT 7.0 06/15/2017   ALBUMIN 4.7 06/15/2017   AST 28 06/15/2017   ALT 38 06/15/2017   ALKPHOS 57 06/15/2017   BILITOT 0.9 06/15/2017   GFRNONAA >60 11/13/2017   GFRAA >60 11/13/2017    Lab Results  Component Value Date   WBC 5.8 11/13/2017   NEUTROABS 3.8 11/13/2017   HGB  17.9 (H) 11/13/2017   HCT 51.5 11/13/2017   MCV 89.1 11/13/2017   PLT 163 11/13/2017     STUDIES: Ct Soft Tissue Neck W Contrast  Result Date: 11/13/2017 CLINICAL DATA:  One year follow-up of lymphoma. EXAM: CT NECK WITH CONTRAST TECHNIQUE: Multidetector CT imaging of the neck was performed using the standard protocol following the bolus administration of intravenous contrast. CONTRAST:  116mL OMNIPAQUE IOHEXOL 300 MG/ML  SOLN COMPARISON:  04/01/2014 FINDINGS: Pharynx and larynx: No thickening of Waldeyer's ring. No incidental mass or inflammation Salivary glands: No inflammation, mass, or stone. Thyroid: Normal. Lymph nodes: Mild generalized prominence of homogeneously enhancing lymph nodes with definite nodal enlargement at the left supraclavicular fossa where index node measures 21 x 12 mm on 14:105. No  newly enlarged node. Vascular: Negative Limited intracranial: Negative Visualized orbits: Negative Mastoids and visualized paranasal sinuses: Clear Skeleton: Diffuse facet spurring.  Lower cervical disc degeneration. Upper chest: Reported separately IMPRESSION: History of SLL with continued improvement in cervical lymphadenopathy. Residual enlarged lymph nodes are primarily limited to the left supraclavicular fossa. Electronically Signed   By: Monte Fantasia M.D.   On: 11/13/2017 12:45   Ct Chest W Contrast  Result Date: 11/13/2017 CLINICAL DATA:  Followup lymphoma EXAM: CT CHEST, ABDOMEN, AND PELVIS WITH CONTRAST TECHNIQUE: Multidetector CT imaging of the chest, abdomen and pelvis was performed following the standard protocol during bolus administration of intravenous contrast. CONTRAST:  163mL OMNIPAQUE IOHEXOL 300 MG/ML  SOLN COMPARISON:  10/31/2016 FINDINGS: CT CHEST FINDINGS Cardiovascular: Normal heart size. No pericardial effusion. Aortic atherosclerosis noted. LAD coronary artery calcification identified. Mediastinum/Nodes: Bilateral subcentimeter axillary lymph nodes are identified  without adenopathy. Borderline enlarged left supraclavicular lymph node is unchanged measuring 1 cm. Subcarinal lymph node measuring 1.5 cm is unchanged from previous exam. Lymph node within the posterior mediastinum adjacent to the esophagus and descending a aorta is stable measuring 1.2 cm. Normal appearance of the thyroid gland. The trachea appears patent and is midline. Lungs/Pleura: No pleural effusion. No suspicious pulmonary nodules. Calcified granuloma identified within the superior segment of the left lower lobe. Musculoskeletal: No chest wall mass or suspicious bone lesions identified. CT ABDOMEN PELVIS FINDINGS Hepatobiliary: No focal liver abnormality is seen. No gallstones, gallbladder wall thickening, or biliary dilatation. Pancreas: Unremarkable. No pancreatic ductal dilatation or surrounding inflammatory changes. Spleen: Normal size spleen. Cystic structure within the anterior spleen measures 1.5 cm, image 63/3. Previously 0.7 cm. Adrenals/Urinary Tract: Normal adrenal glands. The kidneys are unremarkable. No mass or hydronephrosis. The urinary bladder is normal. Stomach/Bowel: Stomach appears normal. The small bowel loops have a normal course and caliber. Normal appearance of the colon. Vascular/Lymphatic: Aortic atherosclerosis without aneurysm. Mild retroperitoneal adenopathy is again noted. Left periaortic node measures 1.4 cm, image 77/3. Unchanged. No new or progressive adenopathy within the abdomen or pelvis. Reproductive: Prostate gland enlargement. Other: No ascites or focal fluid collections. Musculoskeletal: No acute or significant osseous findings. IMPRESSION: 1. Stable exam. Mild mediastinal and retroperitoneal adenopathy is unchanged compared with 10/31/2016. No new or progressive findings. 2. Cystic lesion within the anterior spleen appears increased in size from previous exam now at 1.5 cm. Previously 0.7 cm 3.  Aortic Atherosclerosis (ICD10-I70.0). 4. Coronary artery atherosclerotic  calcifications. Electronically Signed   By: Kerby Moors M.D.   On: 11/13/2017 14:47   Ct Abdomen Pelvis W Contrast  Result Date: 11/13/2017 CLINICAL DATA:  Followup lymphoma EXAM: CT CHEST, ABDOMEN, AND PELVIS WITH CONTRAST TECHNIQUE: Multidetector CT imaging of the chest, abdomen and pelvis was performed following the standard protocol during bolus administration of intravenous contrast. CONTRAST:  136mL OMNIPAQUE IOHEXOL 300 MG/ML  SOLN COMPARISON:  10/31/2016 FINDINGS: CT CHEST FINDINGS Cardiovascular: Normal heart size. No pericardial effusion. Aortic atherosclerosis noted. LAD coronary artery calcification identified. Mediastinum/Nodes: Bilateral subcentimeter axillary lymph nodes are identified without adenopathy. Borderline enlarged left supraclavicular lymph node is unchanged measuring 1 cm. Subcarinal lymph node measuring 1.5 cm is unchanged from previous exam. Lymph node within the posterior mediastinum adjacent to the esophagus and descending a aorta is stable measuring 1.2 cm. Normal appearance of the thyroid gland. The trachea appears patent and is midline. Lungs/Pleura: No pleural effusion. No suspicious pulmonary nodules. Calcified granuloma identified within the superior segment of the left lower lobe. Musculoskeletal: No chest wall mass  or suspicious bone lesions identified. CT ABDOMEN PELVIS FINDINGS Hepatobiliary: No focal liver abnormality is seen. No gallstones, gallbladder wall thickening, or biliary dilatation. Pancreas: Unremarkable. No pancreatic ductal dilatation or surrounding inflammatory changes. Spleen: Normal size spleen. Cystic structure within the anterior spleen measures 1.5 cm, image 63/3. Previously 0.7 cm. Adrenals/Urinary Tract: Normal adrenal glands. The kidneys are unremarkable. No mass or hydronephrosis. The urinary bladder is normal. Stomach/Bowel: Stomach appears normal. The small bowel loops have a normal course and caliber. Normal appearance of the colon.  Vascular/Lymphatic: Aortic atherosclerosis without aneurysm. Mild retroperitoneal adenopathy is again noted. Left periaortic node measures 1.4 cm, image 77/3. Unchanged. No new or progressive adenopathy within the abdomen or pelvis. Reproductive: Prostate gland enlargement. Other: No ascites or focal fluid collections. Musculoskeletal: No acute or significant osseous findings. IMPRESSION: 1. Stable exam. Mild mediastinal and retroperitoneal adenopathy is unchanged compared with 10/31/2016. No new or progressive findings. 2. Cystic lesion within the anterior spleen appears increased in size from previous exam now at 1.5 cm. Previously 0.7 cm 3.  Aortic Atherosclerosis (ICD10-I70.0). 4. Coronary artery atherosclerotic calcifications. Electronically Signed   By: Kerby Moors M.D.   On: 11/13/2017 14:47    ASSESSMENT: Small lymphocytic lymphoma.  PLAN:    1. SLL: Diagnosed on April 13, 2014 from a lymph node excision in his left neck.  CT scan results from November 13, 2017 reviewed independently and report as above with essentially stable and mild lymphadenopathy. It is slightly unusual patient does not have a lymphocytosis concurrent with his lymphadenopathy. His thrombocytopenia is mild and he denies any B symptoms. This is typically a very indolent process and treatment is not required.  If patient requires treatment in the future, would consider single agent Rituxan.  Return to clinic in 1 year with repeat laboratory work and imaging along with further evaluation. 2.  Thrombocytopenia: Chronic and unchanged.  Monitor. 3.  Polycythemia: Patient's hemoglobin has trended up to 17.9.  This is secondary to his testosterone use.  Recommend continuing to check CBC periodically and if his hemoglobin continues to trend up, refer patient back for phlebotomy.    Patient expressed understanding and was in agreement with this plan. He also understands that He can call clinic at any time with any questions, concerns,  or complaints.    Lloyd Huger, MD   11/18/2017 9:14 AM

## 2017-11-12 ENCOUNTER — Ambulatory Visit (INDEPENDENT_AMBULATORY_CARE_PROVIDER_SITE_OTHER): Payer: 59 | Admitting: Family Medicine

## 2017-11-12 ENCOUNTER — Encounter: Payer: Self-pay | Admitting: Family Medicine

## 2017-11-12 VITALS — BP 152/87 | HR 61 | Temp 97.9°F | Ht 67.5 in | Wt 227.7 lb

## 2017-11-12 DIAGNOSIS — M25561 Pain in right knee: Secondary | ICD-10-CM | POA: Diagnosis not present

## 2017-11-12 DIAGNOSIS — M9902 Segmental and somatic dysfunction of thoracic region: Secondary | ICD-10-CM

## 2017-11-12 DIAGNOSIS — M9908 Segmental and somatic dysfunction of rib cage: Secondary | ICD-10-CM

## 2017-11-12 DIAGNOSIS — M9909 Segmental and somatic dysfunction of abdomen and other regions: Secondary | ICD-10-CM

## 2017-11-12 DIAGNOSIS — M9901 Segmental and somatic dysfunction of cervical region: Secondary | ICD-10-CM | POA: Diagnosis not present

## 2017-11-12 DIAGNOSIS — M9905 Segmental and somatic dysfunction of pelvic region: Secondary | ICD-10-CM

## 2017-11-12 DIAGNOSIS — M9903 Segmental and somatic dysfunction of lumbar region: Secondary | ICD-10-CM

## 2017-11-12 DIAGNOSIS — M5412 Radiculopathy, cervical region: Secondary | ICD-10-CM

## 2017-11-12 DIAGNOSIS — N529 Male erectile dysfunction, unspecified: Secondary | ICD-10-CM | POA: Insufficient documentation

## 2017-11-12 DIAGNOSIS — M9900 Segmental and somatic dysfunction of head region: Secondary | ICD-10-CM

## 2017-11-12 DIAGNOSIS — M9904 Segmental and somatic dysfunction of sacral region: Secondary | ICD-10-CM

## 2017-11-12 DIAGNOSIS — G8929 Other chronic pain: Secondary | ICD-10-CM

## 2017-11-12 NOTE — Assessment & Plan Note (Signed)
Stable at this time- occasionally waking him up at night. He does have somatic dysfunction which appears to be contributing to his symptoms. Treated today as below with good results.

## 2017-11-12 NOTE — Progress Notes (Signed)
BP (!) 152/87 (BP Location: Left Arm, Cuff Size: Large)   Pulse 61   Temp 97.9 F (36.6 C) (Oral)   Ht 5' 7.5" (1.715 m)   Wt 227 lb 11.2 oz (103.3 kg)   SpO2 95%   BMI 35.14 kg/m    Subjective:    Patient ID: Jared Cowan, male    DOB: Dec 21, 1955, 62 y.o.   MRN: [id number]  HPI: Jared Cowan is a 62 y.o. male  Chief Complaint  Patient presents with  . Shoulder Pain   Shoulder is doing OK. Has some shooting pain in the center of his back that seems to shoot down his arm and wake him up in the middle of the night. Has been cutting down on the walking due to concern about his knee. He is doing well with the cycling which he is doing on the weekend. Knee has been bothering him the most. Pain is aching and sore. Worse with a lot of activity and better with rest. No radiation.    He is concerned that his blood pressure medicines are possibly causing some erectile dysfunction. He follows with urology. Has been seeing urology and is due to see them again next month. He is not happy with the results from [facility], but is not interested in injections or pumps. No other concerns or complaints at this time.   Relevant past medical, surgical, family and social history reviewed and updated as indicated. Interim medical history since our last visit reviewed. Allergies and medications reviewed and updated.  Review of Systems  Constitutional: Negative.   Respiratory: Negative.   Cardiovascular: Negative.   Musculoskeletal: Positive for arthralgias and myalgias. Negative for back pain, gait problem, joint swelling, neck pain and neck stiffness.  Skin: Negative.   Neurological: Negative.   Psychiatric/Behavioral: Negative.     Per HPI unless specifically indicated above     Objective:    BP (!) 152/87 (BP Location: Left Arm, Cuff Size: Large)   Pulse 61   Temp 97.9 F (36.6 C) (Oral)   Ht 5' 7.5" (1.715 m)   Wt 227 lb 11.2 oz (103.3 kg)   SpO2 95%   BMI 35.14 kg/m   Wt  Readings from Last 3 Encounters:  11/12/17 227 lb 11.2 oz (103.3 kg)  10/11/17 228 lb (103.4 kg)  10/03/17 224 lb 4.8 oz (101.7 kg)    Physical Exam  Constitutional: He is oriented to person, place, and time. He appears well-developed and well-nourished. No distress.  HENT:  Head: Normocephalic and atraumatic.  Right Ear: Hearing normal.  Left Ear: Hearing normal.  Nose: Nose normal.  Eyes: Conjunctivae and lids are normal. Right eye exhibits no discharge. Left eye exhibits no discharge. No scleral icterus.  Pulmonary/Chest: Effort normal. No respiratory distress.  Abdominal: Soft. Bowel sounds are normal. He exhibits no distension and no mass. There is no tenderness. There is no rebound and no guarding. No hernia.  Musculoskeletal: Normal range of motion.  Neurological: He is alert and oriented to person, place, and time.  Skin: Skin is warm, dry and intact. Capillary refill takes less than 2 seconds. No rash noted. He is not diaphoretic. No erythema. No pallor.  Psychiatric: He has a normal mood and affect. His speech is normal and behavior is normal. Judgment and thought content normal. Cognition and memory are normal.  Nursing note and vitals reviewed. Musculoskeletal:  Exam found Decreased ROM, Tissue texture changes, Tenderness to palpation and Asymmetry of patient's  head,  neck, thorax, ribs, lumbar, pelvis, sacrum and abdomen Osteopathic Structural Exam:   Head: OAESSR, OM suture restricted on the R, hypertonic suboccipital muscles, R torsion  Neck: SCM hypertonic on the R, C3-4SLRR  Thorax: T3-5SLRR, trap spam on the R  Ribs: Ribs 4-8 locked up on the R  Lumbar: QL hypertonic on the R, L4-5SRRL  Pelvis: Posterior R innominate  Sacrum: R on R torsion  Abdomen: Diaphragm spasm on the R   Results for orders placed or performed in visit on 06/15/17  Microscopic Examination  Result Value Ref Range   WBC, UA None seen 0 - 5 /hpf   RBC, UA 0-2 0 - 2 /hpf   Epithelial Cells (non  renal) 0-10 0 - 10 /hpf   Renal Epithel, UA 0-10 (A) None seen /hpf   Bacteria, UA None seen None seen/Few  CBC with Differential/Platelet  Result Value Ref Range   WBC 6.4 3.4 - 10.8 x10E3/uL   RBC 5.61 4.14 - 5.80 x10E6/uL   Hemoglobin 17.6 13.0 - 17.7 g/dL   Hematocrit 50.7 37.5 - 51.0 %   MCV 90 79 - 97 fL   MCH 31.4 26.6 - 33.0 pg   MCHC 34.7 31.5 - 35.7 g/dL   RDW 13.9 12.3 - 15.4 %   Platelets 167 150 - 379 x10E3/uL   Neutrophils 65 Not Estab. %   Lymphs 25 Not Estab. %   Monocytes 8 Not Estab. %   Eos 1 Not Estab. %   Basos 1 Not Estab. %   Neutrophils Absolute 4.1 1.4 - 7.0 x10E3/uL   Lymphocytes Absolute 1.6 0.7 - 3.1 x10E3/uL   Monocytes Absolute 0.5 0.1 - 0.9 x10E3/uL   EOS (ABSOLUTE) 0.1 0.0 - 0.4 x10E3/uL   Basophils Absolute 0.0 0.0 - 0.2 x10E3/uL   Immature Granulocytes 0 Not Estab. %   Immature Grans (Abs) 0.0 0.0 - 0.1 x10E3/uL  Comprehensive metabolic panel  Result Value Ref Range   Glucose 78 65 - 99 mg/dL   BUN 19 8 - 27 mg/dL   Creatinine, Ser 1.11 0.76 - 1.27 mg/dL   GFR calc non Af Amer 71 >59 mL/min/1.73   GFR calc Af Amer 82 >59 mL/min/1.73   BUN/Creatinine Ratio 17 10 - 24   Sodium 139 134 - 144 mmol/L   Potassium 4.1 3.5 - 5.2 mmol/L   Chloride 99 96 - 106 mmol/L   CO2 23 20 - 29 mmol/L   Calcium 10.2 8.6 - 10.2 mg/dL   Total Protein 7.0 6.0 - 8.5 g/dL   Albumin 4.7 3.6 - 4.8 g/dL   Globulin, Total 2.3 1.5 - 4.5 g/dL   Albumin/Globulin Ratio 2.0 1.2 - 2.2   Bilirubin Total 0.9 0.0 - 1.2 mg/dL   Alkaline Phosphatase 57 39 - 117 IU/L   AST 28 0 - 40 IU/L   ALT 38 0 - 44 IU/L  Lipid Panel w/o Chol/HDL Ratio  Result Value Ref Range   Cholesterol, Total 154 100 - 199 mg/dL   Triglycerides 84 0 - 149 mg/dL   HDL 39 (L) >39 mg/dL   VLDL Cholesterol Cal 17 5 - 40 mg/dL   LDL Calculated 98 0 - 99 mg/dL  Microalbumin, Urine Waived  Result Value Ref Range   Microalb, Ur Waived 10 0 - 19 mg/L   Creatinine, Urine Waived 100 10 - 300 mg/dL    Microalb/Creat Ratio <30 <30 mg/g  TSH  Result Value Ref Range   TSH 2.490 0.450 - 4.500 uIU/mL  UA/M w/rflx Culture, Routine  Result Value Ref Range   Specific Gravity, UA 1.010 1.005 - 1.030   pH, UA 6.0 5.0 - 7.5   Color, UA Yellow Yellow   Appearance Ur Clear Clear   Leukocytes, UA Negative Negative   Protein, UA Negative Negative/Trace   Glucose, UA Negative Negative   Ketones, UA Negative Negative   RBC, UA Trace (A) Negative   Bilirubin, UA Negative Negative   Urobilinogen, Ur 0.2 0.2 - 1.0 mg/dL   Nitrite, UA Negative Negative   Microscopic Examination See below:   VITAMIN D 25 Hydroxy (Vit-D Deficiency, Fractures)  Result Value Ref Range   Vit D, 25-Hydroxy 62.1 30.0 - 100.0 ng/mL      Assessment & Plan:   Problem List Items Addressed This Visit      Nervous and Auditory   Cervical radiculopathy - Primary    Stable at this time- occasionally waking him up at night. He does have somatic dysfunction which appears to be contributing to his symptoms. Treated today as below with good results.         Other   Erectile dysfunction    Continue to follow with urology. Call with any concerns.        Other Visit Diagnoses    Chronic pain of right knee       Due for surgery. Continue to monitor with orthopedics. Call with any concerns.    Head region somatic dysfunction       Cervical segment dysfunction       Thoracic segment dysfunction       Somatic dysfunction of sacral region       Somatic dysfunction of pelvis region       Rib cage region somatic dysfunction       Somatic dysfunction of lumbar region       Somatic dysfunction of abdominal region         After verbal consent was obtained, patient was treated today with osteopathic manipulative medicine to the regions of the head, neck, thorax, ribs, lumbar, pelvis, sacrum and abdomen using the techniques of cranial, FPR, myofascial release, counterstrain, muscle energy, HVLA and soft tissue. Areas of compensation  relating to his primary pain source also treated. Patient tolerated the procedure well with good objective and good subjective improvement in symptoms. He left the room in good condition. He was advised to stay well hydrated and that he may have some soreness following the procedure. If not improving or worsening, he will call and come in. He will return for reevaluation  on a PRN basis.   Follow up plan: Return As scheduled.

## 2017-11-12 NOTE — Assessment & Plan Note (Signed)
Continue to follow with urology. Call with any concerns.  °

## 2017-11-13 ENCOUNTER — Inpatient Hospital Stay: Payer: 59 | Attending: Oncology

## 2017-11-13 ENCOUNTER — Other Ambulatory Visit: Payer: Self-pay | Admitting: *Deleted

## 2017-11-13 ENCOUNTER — Other Ambulatory Visit
Admission: RE | Admit: 2017-11-13 | Discharge: 2017-11-13 | Disposition: A | Discharge status: Home / Self Care | Payer: 59 | Source: Ambulatory Visit | Attending: Oncology | Admitting: Oncology

## 2017-11-13 ENCOUNTER — Ambulatory Visit
Admission: RE | Admit: 2017-11-13 | Discharge: 2017-11-13 | Disposition: A | Discharge status: Home / Self Care | Payer: 59 | Source: Ambulatory Visit | Attending: Oncology | Admitting: Oncology

## 2017-11-13 DIAGNOSIS — D7389 Other diseases of spleen: Secondary | ICD-10-CM | POA: Diagnosis not present

## 2017-11-13 DIAGNOSIS — R590 Localized enlarged lymph nodes: Secondary | ICD-10-CM | POA: Insufficient documentation

## 2017-11-13 DIAGNOSIS — I2510 Atherosclerotic heart disease of native coronary artery without angina pectoris: Secondary | ICD-10-CM | POA: Insufficient documentation

## 2017-11-13 DIAGNOSIS — C8303 Small cell B-cell lymphoma, intra-abdominal lymph nodes: Secondary | ICD-10-CM

## 2017-11-13 DIAGNOSIS — D751 Secondary polycythemia: Secondary | ICD-10-CM | POA: Diagnosis not present

## 2017-11-13 DIAGNOSIS — D696 Thrombocytopenia, unspecified: Secondary | ICD-10-CM | POA: Diagnosis not present

## 2017-11-13 DIAGNOSIS — I700 Atherosclerosis of aorta: Secondary | ICD-10-CM | POA: Diagnosis not present

## 2017-11-13 DIAGNOSIS — C8301 Small cell B-cell lymphoma, lymph nodes of head, face, and neck: Secondary | ICD-10-CM | POA: Insufficient documentation

## 2017-11-13 LAB — CBC WITH DIFFERENTIAL/PLATELET
Abs Immature Granulocytes: 0.04 10*3/uL (ref 0.00–0.07)
BASOS ABS: 0 10*3/uL (ref 0.0–0.1)
BASOS PCT: 1 %
EOS ABS: 0.1 10*3/uL (ref 0.0–0.5)
EOS PCT: 1 %
HCT: 51.5 % (ref 39.0–52.0)
Hemoglobin: 17.9 g/dL — ABNORMAL HIGH (ref 13.0–17.0)
Immature Granulocytes: 1 %
LYMPHS ABS: 1.2 10*3/uL (ref 0.7–4.0)
Lymphocytes Relative: 21 %
MCH: 31 pg (ref 26.0–34.0)
MCHC: 34.8 g/dL (ref 30.0–36.0)
MCV: 89.1 fL (ref 80.0–100.0)
Monocytes Absolute: 0.6 10*3/uL (ref 0.1–1.0)
Monocytes Relative: 11 %
NRBC: 0 % (ref 0.0–0.2)
Neutro Abs: 3.8 10*3/uL (ref 1.7–7.7)
Neutrophils Relative %: 65 %
PLATELETS: 163 10*3/uL (ref 150–400)
RBC: 5.78 MIL/uL (ref 4.22–5.81)
RDW: 12.4 % (ref 11.5–15.5)
WBC: 5.8 10*3/uL (ref 4.0–10.5)

## 2017-11-13 LAB — CREATININE, SERUM: CREATININE: 1.16 mg/dL (ref 0.61–1.24)

## 2017-11-13 MED ORDER — IOHEXOL 300 MG/ML  SOLN
150.0000 mL | Freq: Once | INTRAMUSCULAR | Status: AC | PRN
  Administered 2017-11-13: 125 mL via INTRAVENOUS

## 2017-11-16 ENCOUNTER — Inpatient Hospital Stay (HOSPITAL_BASED_OUTPATIENT_CLINIC_OR_DEPARTMENT_OTHER): Payer: 59 | Admitting: Oncology

## 2017-11-16 ENCOUNTER — Encounter: Payer: Self-pay | Admitting: Oncology

## 2017-11-16 VITALS — BP 168/98 | HR 89 | Temp 97.0°F | Resp 20 | Wt 225.8 lb

## 2017-11-16 DIAGNOSIS — D751 Secondary polycythemia: Secondary | ICD-10-CM

## 2017-11-16 DIAGNOSIS — D696 Thrombocytopenia, unspecified: Secondary | ICD-10-CM

## 2017-11-16 DIAGNOSIS — C8303 Small cell B-cell lymphoma, intra-abdominal lymph nodes: Secondary | ICD-10-CM

## 2017-11-16 DIAGNOSIS — C8301 Small cell B-cell lymphoma, lymph nodes of head, face, and neck: Secondary | ICD-10-CM

## 2017-11-16 LAB — COMP PANEL: LEUKEMIA/LYMPHOMA

## 2017-11-16 NOTE — Progress Notes (Signed)
Patient here today for CT results. 

## 2017-11-19 ENCOUNTER — Encounter: Payer: Self-pay | Admitting: Family Medicine

## 2017-11-20 ENCOUNTER — Ambulatory Visit (INDEPENDENT_AMBULATORY_CARE_PROVIDER_SITE_OTHER): Payer: 59 | Admitting: Family Medicine

## 2017-11-20 ENCOUNTER — Encounter: Payer: Self-pay | Admitting: Family Medicine

## 2017-11-20 VITALS — BP 128/81 | HR 62 | Wt 225.0 lb

## 2017-11-20 DIAGNOSIS — M1711 Unilateral primary osteoarthritis, right knee: Secondary | ICD-10-CM | POA: Diagnosis not present

## 2017-11-20 NOTE — Progress Notes (Signed)
BP 128/81   Pulse 62   Wt 225 lb (102.1 kg)   SpO2 98%   BMI 34.72 kg/m    Subjective:    Patient ID: DRAVEN NATTER, male    DOB: February 14, 1955, 62 y.o.   MRN: [id number]  HPI: KEVYN BOQUET is a 62 y.o. male  Chief Complaint  Patient presents with  . Knee Pain   KNEE PAIN- known arthritis in his knee. To have knee replacement in December. Going to [place] and is going to be walking around a lot. Would like a cortisone injection to help with the pain while on vacation. Otherwise feeling well. Duration: chronic Involved knee: right Mechanism of injury: chronic arthritis Location:diffuse Onset: gradual Severity: 1-3/10  Quality:  Aching, occasionally sharp Frequency: constant Radiation: no Aggravating factors: weight bearing, walking, running, bending and movement  Alleviating factors: nothing   Status: worse Treatments attempted: rest, ice, ibuprofen and aleve  Relief with NSAIDs?:  mild Weakness with weight bearing or walking: no Sensation of giving way: no Locking: no Popping: no Bruising: no Swelling: yes Redness: no Paresthesias/decreased sensation: no Fevers: no  Relevant past medical, surgical, family and social history reviewed and updated as indicated. Interim medical history since our last visit reviewed. Allergies and medications reviewed and updated.  Review of Systems  Constitutional: Negative.   Respiratory: Negative.   Cardiovascular: Negative.   Musculoskeletal: Positive for arthralgias, gait problem and joint swelling. Negative for back pain, myalgias, neck pain and neck stiffness.  Skin: Negative.   Psychiatric/Behavioral: Negative.     Per HPI unless specifically indicated above     Objective:    BP 128/81   Pulse 62   Wt 225 lb (102.1 kg)   SpO2 98%   BMI 34.72 kg/m   Wt Readings from Last 3 Encounters:  11/20/17 225 lb (102.1 kg)  11/16/17 225 lb 12 oz (102.4 kg)  11/12/17 227 lb 11.2 oz (103.3 kg)    Physical Exam    Constitutional: He is oriented to person, place, and time. He appears well-developed and well-nourished. No distress.  HENT:  Head: Normocephalic and atraumatic.  Right Ear: Hearing normal.  Left Ear: Hearing normal.  Nose: Nose normal.  Eyes: Conjunctivae and lids are normal. Right eye exhibits no discharge. Left eye exhibits no discharge. No scleral icterus.  Cardiovascular: Normal rate, regular rhythm, normal heart sounds and intact distal pulses. Exam reveals no gallop and no friction rub.  No murmur heard. Pulmonary/Chest: Effort normal and breath sounds normal. No stridor. No respiratory distress. He has no wheezes. He has no rales. He exhibits no tenderness.  Musculoskeletal: He exhibits edema, tenderness and deformity.  Neurological: He is alert and oriented to person, place, and time.  Skin: Skin is warm, dry and intact. Capillary refill takes less than 2 seconds. No rash noted. He is not diaphoretic. No erythema. No pallor.  Psychiatric: He has a normal mood and affect. His speech is normal and behavior is normal. Judgment and thought content normal. Cognition and memory are normal.  Nursing note and vitals reviewed.   Results for orders placed or performed during the hospital encounter of 11/13/17  Creatinine, serum  Result Value Ref Range   Creatinine, Ser 1.16 0.61 - 1.24 mg/dL   GFR calc non Af Amer >60 >60 mL/min   GFR calc Af Amer >60 >60 mL/min      Assessment & Plan:   Problem List Items Addressed This Visit    None  Visit Diagnoses    Arthritis of right knee    -  Primary   Steroid shot given today as below. Call with any concerns.      Procedure: Right  Knee Intraarticular Steroid Injection        Diagnosis:   ICD-10-CM   1. Arthritis of right knee M17.11    Steroid shot given today as below. Call with any concerns.     Physician: MJ Consent:  Risks, benefits, and alternative treatments discussed and all questions were answered.  Patient elected to  proceed and verbal consent obtained.  Description: Area prepped and draped using  semi-sterile technique.  Using a anterior/lateral approach, a mixture of 4 cc of  1% lidocaine & 1 cc of Kenalog 40 was injected into knee joint.  A bandage was then placed over the injection site Complications: none Post Procedure Instructions: Wound care instructions discussed and patient was instructed to keep area clean and dry.  Signs and symptoms of infection discussed, patient agrees to contact the office ASAP should they occur.    Follow up plan: Return if symptoms worsen or fail to improve.

## 2017-11-22 IMAGING — CT CT NECK W/ CM
4 of 5 series · 16 of 33 positions shown, 19 images · IV contrast (iopamidol)
Comparison: 11/02/2015

CLINICAL DATA: Restaging B-cell lymphoma.

EXAM:
CT NECK WITH CONTRAST
TECHNIQUE: Multidetector CT imaging of the neck was performed using the
standard protocol following the bolus administration of intravenous
contrast.
CONTRAST:  125mL 1I10LB-[id number] IOPAMIDOL (1I10LB-[id number]) INJECTION 61%

[Series 6: axial neck · axial · 0.55mm/px · z∈[-399,-279]mm · 3 of 121 slices shown]
[im 31/121  bone]
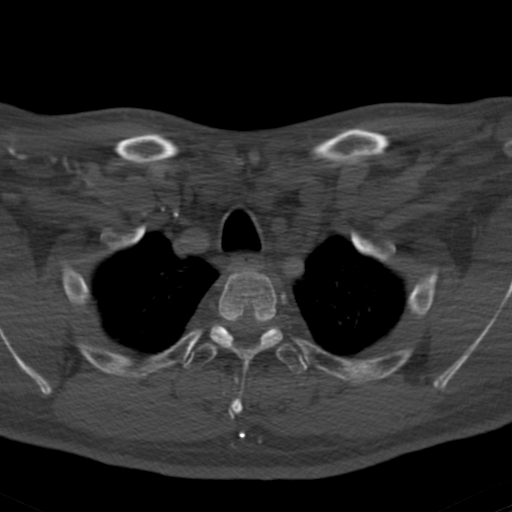
[im 61/121  bone]
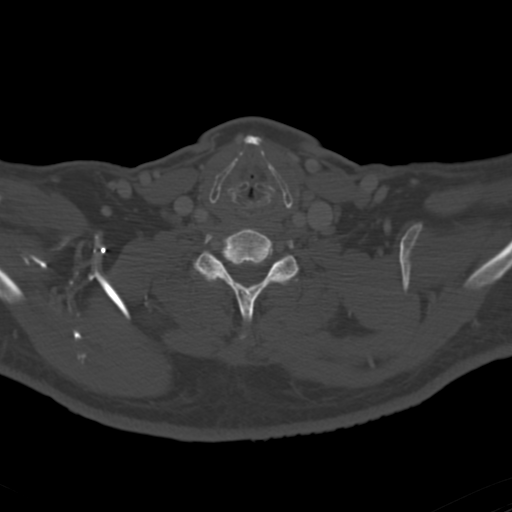
[im 91/121  bone]
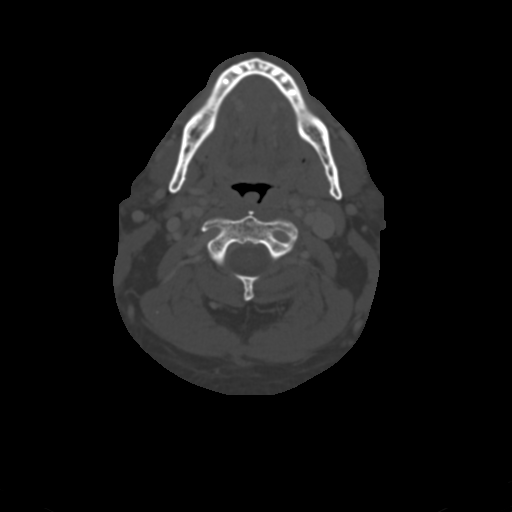

[Series 605: coronal neck · coronal · 0.55mm/px · 3 of 119 slices shown]
[im 36/119  bone]
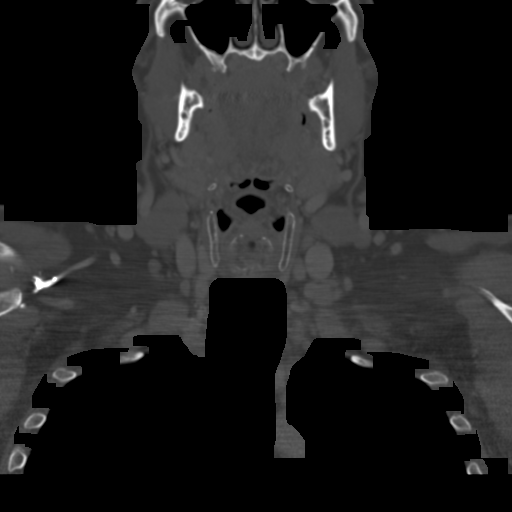
[im 51/119  bone]
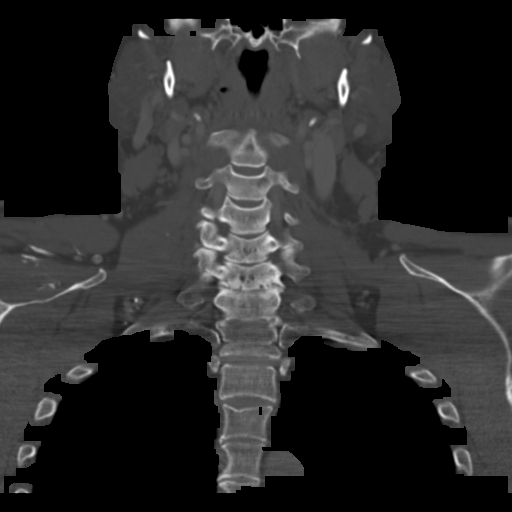
[im 67/119  bone]
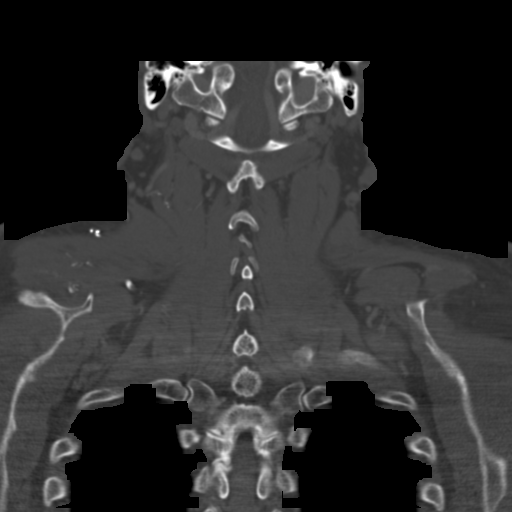

[Series 606: sagittal neck · sagittal · 0.55mm/px · 5 of 106 slices shown, 6 images]
[im 36/106  bone]
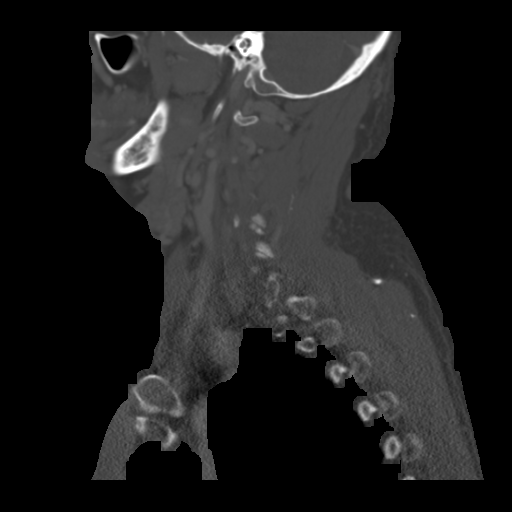
[im 44/106  bone]
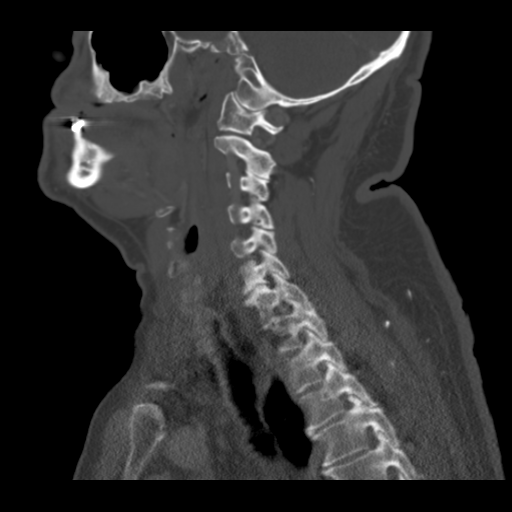
[im 53/106  soft-tissue]
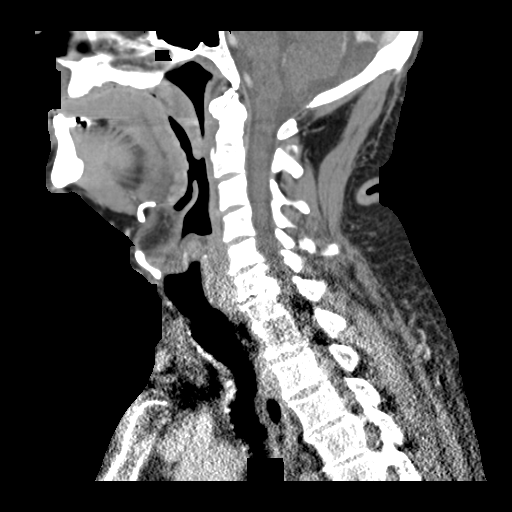
[im 53/106  bone]
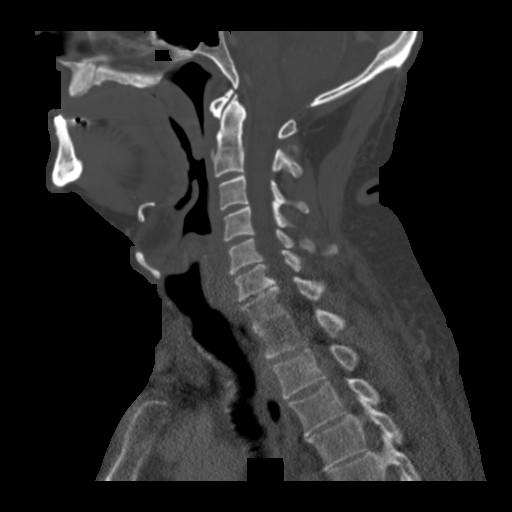
[im 62/106  bone]
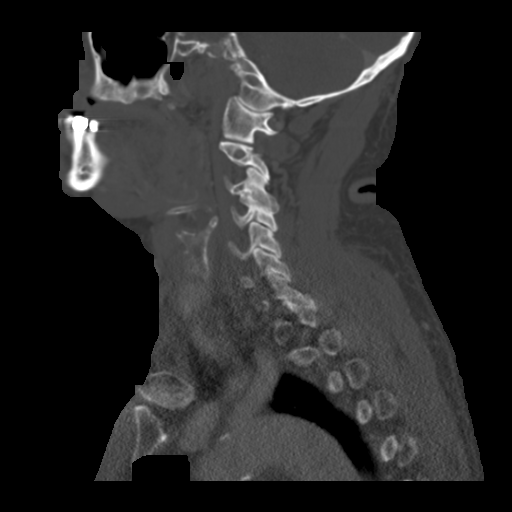
[im 71/106  bone]
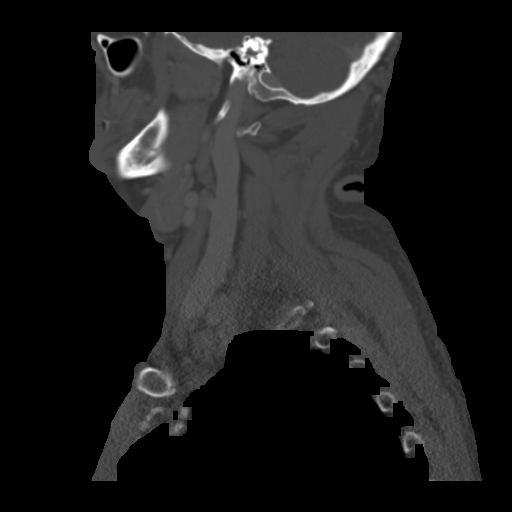

[Series 607: oropharynx · axial · 0.55mm/px · z∈[-473,-280]mm · 5 of 150 slices shown, 7 images]
[im 25/150  soft-tissue]
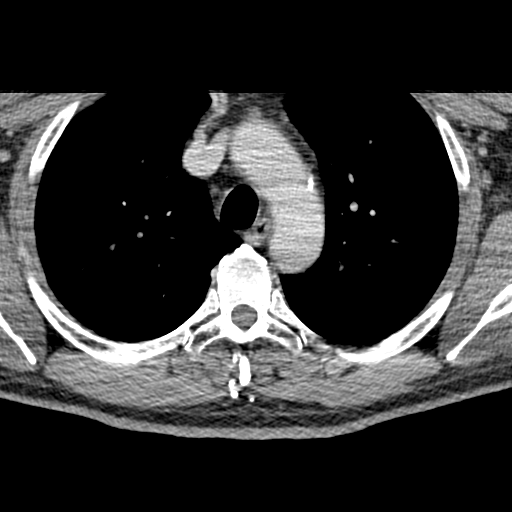
[im 25/150  bone]
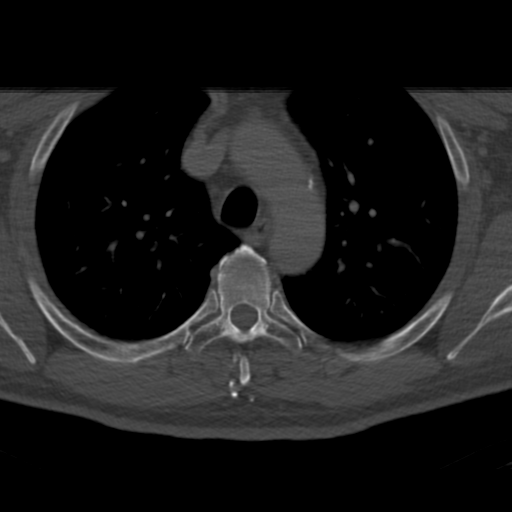
[im 50/150  bone]
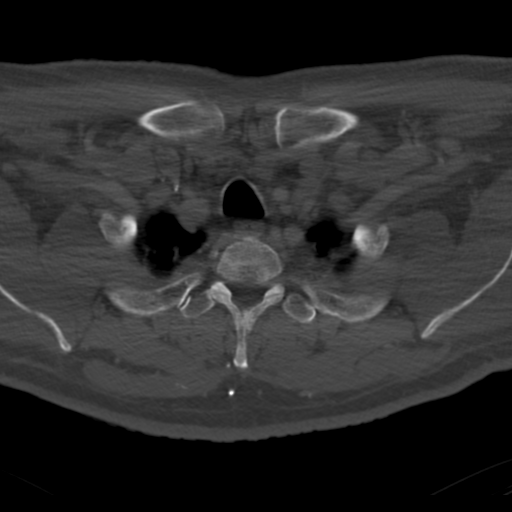
[im 75/150  bone]
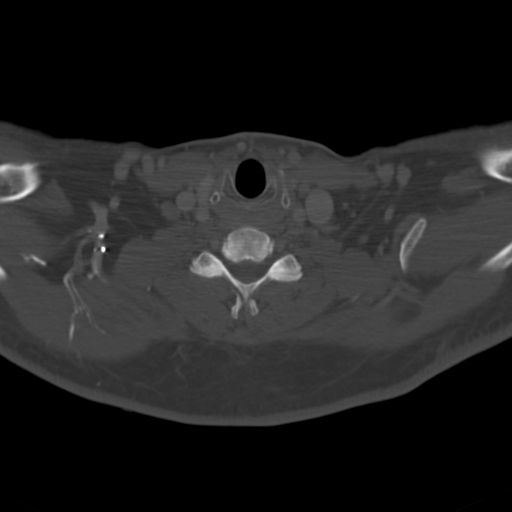
[im 100/150  bone]
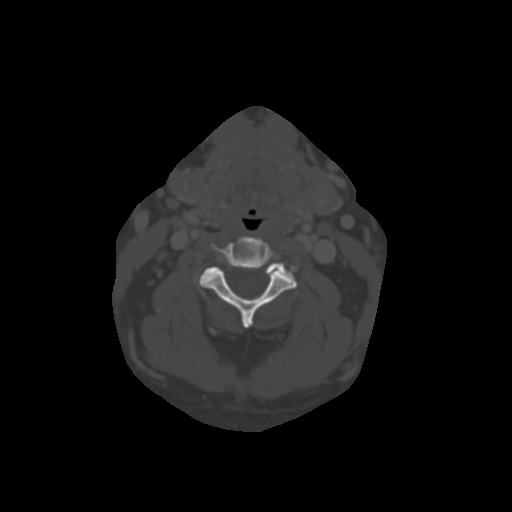
[im 125/150  soft-tissue]
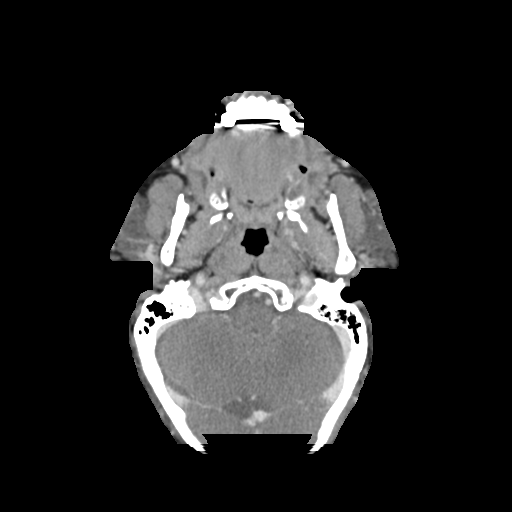
[im 125/150  bone]
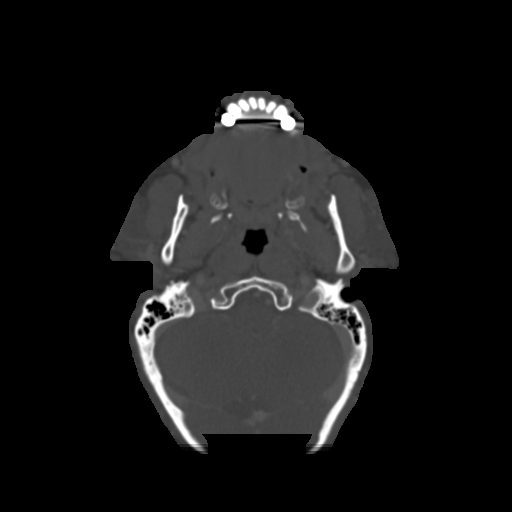

[16 of 33 positions shown; findings below may reference images not displayed]

FINDINGS: Pharynx and larynx: Negative for mass or inflammation. No thickening
of Waldeyer's ring.

Salivary glands: Symmetric and unremarkable.

Thyroid: Normal

Lymph nodes: Continued conspicuous number of cervical lymph nodes.
Index measurements re- taken:

Right jugulodigastric node which is decreased in size from 13 mm
long axis to 10 mm.

A left posterior triangle node on [DATE] has decreased to 10 x 6 mm
compared to 13 x 6 mm.

Asymmetric enlargement of left supraclavicular fossa nodes as
before. The node measured on [DATE] measures 16 x 23 mm, similar to
prior.

Right supraclavicular nodes have mildly decreased in size. No new or
increasing node seen. Stable nodal density, primarily homogeneously
enhancing.

Vascular: Major vessels are patent.

Limited intracranial: Negative

Visualized orbits: Not seen

Mastoids and visualized paranasal sinuses: Clear where visualized

Skeleton: Remote T3 superior endplate fracture. C6-7 disc
degeneration with uncovertebral spurs narrowing the foramina. C3-4
facet ankylosis.

Upper chest: Reported separately.
IMPRESSION: 1. History of lymphoma with bilateral cervical and supraclavicular
involvement. Nodes are decreased or stable in size when compared to
CT 1 year prior. No new or progressive finding.
2. Chest CT reported separately.

## 2017-12-07 ENCOUNTER — Encounter: Payer: Self-pay | Admitting: Family Medicine

## 2017-12-10 DIAGNOSIS — M1711 Unilateral primary osteoarthritis, right knee: Secondary | ICD-10-CM | POA: Diagnosis present

## 2017-12-10 NOTE — H&P (Signed)
KNEE ARTHROPLASTY ADMISSION H&P  Patient ID: Jared Cowan MRN: [id number] DOB/AGE: Aug 10, 1955 62 y.o.  Chief Complaint: right knee pain.  Planned Procedure Date: 01/01/2018 Medical Clearance by Dr. Park Liter Cardiac Clearance by Dr. Clayborn Bigness Additional clearance by oncology: Dr. Grayland Ormond  HPI: Jared Cowan is a 62 y.o. 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 The patient has a history of pain and functional disability in the right knee due to arthritis and has failed non-surgical conservative treatments for greater than 12 weeks to include NSAID's and/or analgesics, corticosteriod injections and activity modification.  Onset of symptoms was gradual, starting 3 + years ago with gradually worsening course since that time.  Patient currently rates pain at 6 out of 10 with activity. Patient has night pain, worsening of pain with activity and weight bearing and pain that interferes with activities of daily living.  Patient has evidence of periarticular osteophytes and joint space narrowing by imaging studies.  There is no active infection.  Past Medical History:  Diagnosis Date  . Cancer ([place]) 03/2014   Small Lymphotic Lymphoma  . Fracture    left arm  . 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 Hemorrhoids   . Hyperlipidemia   . Hypertension   . Low testosterone   . Peripheral vascular disease ([place])   . Plantar fasciitis of left foot   . Precancerous skin lesion    Eye  . Sleep apnea    CPAP  . Wears dentures    full upper, partial lower   Past Surgical History:  Procedure Laterality Date  . APPENDECTOMY    . COLONOSCOPY WITH PROPOFOL N/A 12/13/2016   Procedure: COLONOSCOPY WITH PROPOFOL;  Surgeon: Lin Landsman, MD;  Location: [facility];  Service: Endoscopy;  Laterality: N/A;   sleep apnea  . [place]  . LASER OF PROSTATE W/ GREEN LIGHT PVP  2014  . LEFT HEART CATH AND CORONARY ANGIOGRAPHY Left 03/21/2016   Procedure: Left Heart Cath and Coronary Angiography;  Surgeon: Yolonda Kida, MD;  Location: [facility];  Service: Cardiovascular;  Laterality: Left;  . LYMPH NODE BIOPSY Left 03/2014   cancerous  . PROSTATE SURGERY    . SKIN BIOPSY     Eyelid  . TONSILLECTOMY    . urethra and bladder unblocked  2012   Allergies  Allergen Reactions  . Adhesive [Tape] Rash    Bandaids after long term use   Prior to Admission medications   Medication Sig Start Date End Date Taking? Authorizing Provider  benazepril (LOTENSIN) 20 MG tablet TAKE ONE & ONE-HALF TABLETS BY MOUTH ONCE DAILY 06/15/17   Wynetta Emery, Megan P, DO  hydrochlorothiazide (HYDRODIURIL) 25 MG tablet Take 1 tablet (25 mg total) by mouth daily. 06/15/17   Johnson, Megan P, DO  Ibuprofen-Famotidine 800-26.6 MG TABS Take 1 tablet by mouth daily. 09/22/16   Park Liter P, DO  Multiple Vitamin (MULTIVITAMIN WITH MINERALS) TABS tablet Take 1 tablet by mouth daily.    [provider]  Omega-3 Fatty Acids (EQL OMEGA 3 FISH OIL) 1200 MG CPDR Take 1 capsule by mouth daily. Also includes 360mg  of Omega 3 09/22/16   Johnson, Megan P, DO  rosuvastatin (CRESTOR) 20 MG tablet Take 1 tablet (20 mg total) by mouth daily. 06/15/17   Wynetta Emery,  Megan P, DO  Testosterone (ANDROGEL PUMP) 20.25 MG/ACT (1.62%) GEL Apply topically. 12/30/16   [provider]  vardenafil (LEVITRA) 20 MG tablet See admin instructions. 06/18/17   [provider]  Vitamins/Minerals TABS Take by mouth.    [provider]   Social History   Socioeconomic History  . Marital status: Married    Spouse name: Not on file  . Number of children: Not on file  . Years of education: Not on file  . Highest education level: Not on file  Occupational History  . Not on file  Social Needs  . Financial  resource strain: Not on file  . Food insecurity:    Worry: Not on file    Inability: Not on file  . Transportation needs:    Medical: Not on file    Non-medical: Not on file  Tobacco Use  . Smoking status: Former Smoker    Last attempt to quit: 01/31/1996    Years since quitting: 21.8  . Smokeless tobacco: Never Used  Substance and Sexual Activity  . Alcohol use: Yes    Alcohol/week: 14.0 standard drinks    Types: 14 Shots of liquor per week  . Drug use: No  . Sexual activity: Not Currently  Lifestyle  . Physical activity:    Days per week: Not on file    Minutes per session: Not on file  . Stress: Not on file  Relationships  . Social connections:    Talks on phone: Not on file    Gets together: Not on file    Attends religious service: Not on file    Active member of club or organization: Not on file    Attends meetings of clubs or organizations: Not on file    Relationship status: Not on file  Other Topics Concern  . Not on file  Social History Narrative  . Not on file   Family History  Problem Relation Age of Onset  . Dementia Mother 58  . Hypertension Mother   . Hypertension Father   . Heart attack Father   . Heart disease Father   . Cancer Sister        breast  . Cancer Sister        breast  . Early death Paternal Grandfather   . Heart disease Paternal Grandfather     ROS: Currently denies lightheadedness, dizziness, Fever, chills, CP, SOB.   No personal history of DVT, PE, MI, or CVA. No loose or painful teeth.  Top denture, bottom partial. He wears glasses. All other systems have been reviewed and were otherwise currently negative with the exception of those mentioned in the HPI and as above.  Objective: Vitals: Ht: 5 feet 9 inches wt: 230 temp: 97.6 BP: 142/83 pulse: 67 O2 95 % on room air.   Physical Exam: General: Alert, NAD.  Antalgic Gait  HEENT: EOMI, Good Neck Extension  Pulm: No increased work of breathing.  Clear B/L A/P w/o crackle or  wheeze.  CV: RRR, No m/g/r appreciated  GI: soft, NT, ND Neuro: Neuro without gross focal deficit.  Sensation intact distally Skin: No lesions in the area of chief complaint MSK/Surgical Site: Right knee w/o redness or effusion.  + JLT. ROM 2-115.  5/5 strength in extension and flexion.  +EHL/FHL.  NVI.  Stable varus and valgus stress.    Imaging Review Plain radiographs demonstrate severe degenerative joint disease of the right knee.   Preoperative templating of the joint replacement has  been completed, documented, and submitted to the Operating Room personnel in order to optimize intra-operative equipment management.  Assessment: OA RIGHT KNEE Principal Problem:   Primary osteoarthritis of right knee Active Problems:   Small cell B-cell lymphoma (HCC)   Essential hypertension   Hyperlipidemia   Plan: Plan for Procedure(s): RIGHT TOTAL KNEE ARTHROPLASTY  The patient history, physical exam, clinical judgement of the provider and imaging are consistent with end stage degenerative joint disease and total joint arthroplasty is deemed medically necessary. The treatment options including medical management, injection therapy, and arthroplasty were discussed at length. The risks and benefits of Procedure(s): RIGHT TOTAL KNEE ARTHROPLASTY were presented and reviewed.  The risks of nonoperative treatment, versus surgical intervention including but not limited to continued pain, aseptic loosening, stiffness, dislocation/subluxation, infection, bleeding, nerve injury, blood clots, cardiopulmonary complications, morbidity, mortality, among others were discussed. The patient verbalizes understanding and wishes to proceed with the plan.  Patient is being admitted for inpatient treatment for surgery, pain control, PT, OT, prophylactic antibiotics, VTE prophylaxis, progressive ambulation, ADL's and discharge planning.   Dental prophylaxis discussed and recommended for 2 years  postoperatively.   The patient does meet the criteria for TXA which will be used perioperatively.    Xarelto  will be used postoperatively for DVT prophylaxis dt cancer hx and increased risk from testosterone therapy in addition to SCDs, and early ambulation.  Plan post op Rx for Tylenol, oxycodone, Robaxin, Gabapentin and IBU 800 mg (w/ Pepcid BID).  The patient is planning to be discharged home with home health services ([facility]) in care of his wife.  Order CPAP for OSA.  Consider phlebotomy if polycytemia trends up from 17.9 per Dr. Grayland Ormond (Onc).   Patient's anticipated LOS is less than 2 midnights, meeting these requirements: - Younger than 33 - Lives within 1 hour of care - Has a competent adult at home to recover with post-op recover - NO history of  - Chronic pain requiring opiods  - Diabetes  - Coronary Artery Disease  - Heart failure  - Heart attack  - Stroke  - DVT/VTE  - Cardiac arrhythmia  - Respiratory Failure/COPD  - Renal failure  - Anemia  - Advanced Liver disease   Prudencio Burly III, PA-C 12/10/2017 8:04 AM

## 2017-12-11 ENCOUNTER — Other Ambulatory Visit: Payer: Self-pay

## 2017-12-11 ENCOUNTER — Encounter: Payer: Self-pay | Admitting: Family Medicine

## 2017-12-11 ENCOUNTER — Ambulatory Visit (INDEPENDENT_AMBULATORY_CARE_PROVIDER_SITE_OTHER): Payer: 59 | Admitting: Family Medicine

## 2017-12-11 VITALS — BP 131/79 | HR 60 | Temp 98.0°F | Ht 67.5 in | Wt 228.0 lb

## 2017-12-11 DIAGNOSIS — Z01818 Encounter for other preprocedural examination: Secondary | ICD-10-CM | POA: Diagnosis not present

## 2017-12-11 LAB — COAGUCHEK XS/INR WAIVED
INR: 1 (ref 0.9–1.1)
PROTHROMBIN TIME: 12 s

## 2017-12-11 NOTE — Progress Notes (Addendum)
BP 131/79   Pulse 60   Temp 98 F (36.7 C) (Oral)   Ht 5' 7.5" (1.715 m)   Wt 228 lb (103.4 kg)   SpO2 97%   BMI 35.18 kg/m    Subjective:    Patient ID: Jared Cowan, male    DOB: February 03, 1955, 62 y.o.   MRN: [id number]  HPI: Jared Cowan is a 62 y.o. male  Chief Complaint  Patient presents with  . Surgical clearance   To have total knee replacement on 01/01/18. He has been feeling well with the exception of his knee. He is usually able to ride dozens of miles on a bike without any SOB or CP, only issue now holding him up is his knee. He has had surgery before with anesthesia. He has never had any complications from anesthesia in the past. He has had no issues with being extubated. He has always gone home when he was supposed to. No issues with peri-operative N/V. He does note that he occasionally got anxious following dental procedures- he will make sure to mention this to his anesthesiologist. He has no family history of issues with anesthesia or malignant hyperthermia. He is otherwise feeling well and is about to go out of town to [place] with his grandson. No other concerns or complaints at this time.    Active Ambulatory Problems    Diagnosis Date Noted  . Small cell B-cell lymphoma ([place]) 07/14/2014  . Hamstring strain 07/24/2014  . Frozen shoulder 07/24/2014  . Essential hypertension 09/24/2014  . Hyperlipidemia 09/24/2014  . Allergic rhinitis 11/23/2014  . Low testosterone 09/27/2015  . 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 Pulled hamstring 07/24/2014  . Special screening for malignant neoplasms, colon   . Cervical radiculopathy 10/15/2017  . Erectile dysfunction 11/12/2017  . Primary osteoarthritis of right knee 12/10/2017   Resolved Ambulatory Problems    Diagnosis Date Noted  . No Resolved Ambulatory Problems   Past Medical History:  Diagnosis Date  . Cancer ([place]) 03/2014  . Fracture   . Hemorrhoids   . Hypertension   . Peripheral  vascular disease ([place])   . Plantar fasciitis of left foot   . Precancerous skin lesion   . Sleep apnea   . Vitreous degeneration, left eye   . Wears dentures    Past Surgical History:  Procedure Laterality Date  . APPENDECTOMY    . COLONOSCOPY WITH PROPOFOL N/A 12/13/2016   Procedure: COLONOSCOPY WITH PROPOFOL;  Surgeon: Lin Landsman, MD;  Location: [facility];  Service: Endoscopy;  Laterality: N/A;  sleep apnea  . [place]  . LASER OF PROSTATE W/ GREEN LIGHT PVP  2014  . LEFT HEART CATH AND CORONARY ANGIOGRAPHY Left 03/21/2016   Procedure: Left Heart Cath and Coronary Angiography;  Surgeon: Yolonda Kida, MD;  Location: [facility];  Service: Cardiovascular;  Laterality: Left;  . LYMPH NODE BIOPSY Left 03/2014   cancerous  . PROSTATE SURGERY    . SKIN BIOPSY     Eyelid  . TONSILLECTOMY    . urethra and bladder unblocked  2012   Outpatient Encounter Medications as of 12/11/2017  Medication Sig  . benazepril (LOTENSIN) 20 MG tablet TAKE ONE & ONE-HALF TABLETS BY MOUTH ONCE DAILY  . hydrochlorothiazide (HYDRODIURIL) 25 MG tablet Take 1 tablet (25 mg total) by mouth daily.  . Ibuprofen-Famotidine 800-26.6 MG TABS Take 1 tablet by mouth daily.  . Multiple Vitamin (MULTIVITAMIN WITH  MINERALS) TABS tablet Take 1 tablet by mouth daily.  . Omega-3 Fatty Acids (EQL OMEGA 3 FISH OIL) 1200 MG CPDR Take 1 capsule by mouth daily. Also includes 360mg  of Omega 3  . rosuvastatin (CRESTOR) 20 MG tablet Take 1 tablet (20 mg total) by mouth daily.  . Testosterone (ANDROGEL PUMP) 20.25 MG/ACT (1.62%) GEL Apply topically.  . vardenafil (LEVITRA) 20 MG tablet See admin instructions.  . [DISCONTINUED] Vitamins/Minerals TABS Take by mouth.   No facility-administered encounter medications on file as of 12/11/2017.    Allergies  Allergen Reactions  . Adhesive [Tape] Rash    Bandaids after long term use   Social History   Socioeconomic History  . Marital  status: Married    Spouse name: Not on file  . Number of children: Not on file  . Years of education: Not on file  . Highest education level: Not on file  Occupational History  . Not on file  Social Needs  . Financial resource strain: Not on file  . Food insecurity:    Worry: Not on file    Inability: Not on file  . Transportation needs:    Medical: Not on file    Non-medical: Not on file  Tobacco Use  . Smoking status: Former Smoker    Last attempt to quit: 01/31/1996    Years since quitting: 21.8  . Smokeless tobacco: Never Used  Substance and Sexual Activity  . Alcohol use: Yes    Alcohol/week: 14.0 standard drinks    Types: 14 Shots of liquor per week  . Drug use: No  . Sexual activity: Not Currently  Lifestyle  . Physical activity:    Days per week: Not on file    Minutes per session: Not on file  . Stress: Not on file  Relationships  . Social connections:    Talks on phone: Not on file    Gets together: Not on file    Attends religious service: Not on file    Active member of club or organization: Not on file    Attends meetings of clubs or organizations: Not on file    Relationship status: Not on file  Other Topics Concern  . Not on file  Social History Narrative  . Not on file   Family History  Problem Relation Age of Onset  . Dementia Mother 5  . Hypertension Mother   . Hypertension Father   . Heart attack Father   . Heart disease Father   . Cancer Sister        breast  . Cancer Sister        breast  . Early death Paternal Grandfather   . Heart disease Paternal Grandfather     Review of Systems  Constitutional: Negative.   HENT: Negative.   Respiratory: Negative.   Cardiovascular: Negative.   Musculoskeletal: Positive for arthralgias. Negative for back pain, gait problem, joint swelling, myalgias, neck pain and neck stiffness.  Skin: Negative.   Neurological: Negative.   Psychiatric/Behavioral: Negative.     Per HPI unless specifically  indicated above     Objective:    BP 131/79   Pulse 60   Temp 98 F (36.7 C) (Oral)   Ht 5' 7.5" (1.715 m)   Wt 228 lb (103.4 kg)   SpO2 97%   BMI 35.18 kg/m   Wt Readings from Last 3 Encounters:  12/11/17 228 lb (103.4 kg)  11/20/17 225 lb (102.1 kg)  11/16/17 225 lb 12 oz (  102.4 kg)    Physical Exam  Constitutional: He is oriented to person, place, and time. He appears well-developed and well-nourished. No distress.  HENT:  Head: Normocephalic and atraumatic.  Right Ear: Hearing and external ear normal.  Left Ear: Hearing and external ear normal.  Nose: Nose normal.  Mouth/Throat: Oropharynx is clear and moist. No oropharyngeal exudate.  Eyes: Pupils are equal, round, and reactive to light. Conjunctivae, EOM and lids are normal. Right eye exhibits no discharge. Left eye exhibits no discharge. No scleral icterus.  Neck: Normal range of motion. Neck supple. No JVD present. No tracheal deviation present. No thyromegaly present.  Cardiovascular: Normal rate, regular rhythm, normal heart sounds and intact distal pulses. Exam reveals no gallop and no friction rub.  No murmur heard. Pulmonary/Chest: Effort normal and breath sounds normal. No stridor. No respiratory distress. He has no wheezes. He has no rales. He exhibits no tenderness.  Abdominal: Soft. Bowel sounds are normal. He exhibits no distension and no mass. There is no tenderness. There is no rebound and no guarding. No hernia.  Musculoskeletal: Normal range of motion.  Lymphadenopathy:    He has no cervical adenopathy.  Neurological: He is alert and oriented to person, place, and time.  Skin: Skin is warm, dry and intact. Capillary refill takes less than 2 seconds. No rash noted. He is not diaphoretic. No erythema. No pallor.  Psychiatric: He has a normal mood and affect. His speech is normal and behavior is normal. Judgment and thought content normal. Cognition and memory are normal.  Nursing note and vitals  reviewed.   Results for orders placed or performed in visit on 12/11/17  CoaguChek XS/INR Waived  Result Value Ref Range   INR 1.0 0.9 - 1.1   Prothrombin Time 12.0 sec      Assessment & Plan:   Problem List Items Addressed This Visit    None    Visit Diagnoses    Pre-operative clearance    -  Primary   EKG bradycardic- long term athelete. Checking labs today, pending results, should be able to be cleared for surgery without issue. Call with any concerns.    Relevant Orders   EKG 12-Lead (Completed)   Comprehensive metabolic panel   CBC with Differential/Platelet   CoaguChek XS/INR Waived (Completed)       Follow up plan: Return if symptoms worsen or fail to improve.   ADDENDUM: 12/12/17 12:06PM- On review of labs, patient is cleared for surgery. Call with any concerns.

## 2017-12-12 LAB — CBC WITH DIFFERENTIAL/PLATELET
BASOS ABS: 0.1 10*3/uL (ref 0.0–0.2)
Basos: 1 %
EOS (ABSOLUTE): 0.1 10*3/uL (ref 0.0–0.4)
EOS: 2 %
Hematocrit: 53 % — ABNORMAL HIGH (ref 37.5–51.0)
Hemoglobin: 18.1 g/dL — ABNORMAL HIGH (ref 13.0–17.7)
Immature Grans (Abs): 0 10*3/uL (ref 0.0–0.1)
Immature Granulocytes: 1 %
Lymphocytes Absolute: 1.7 10*3/uL (ref 0.7–3.1)
Lymphs: 27 %
MCH: 30.6 pg (ref 26.6–33.0)
MCHC: 34.2 g/dL (ref 31.5–35.7)
MCV: 90 fL (ref 79–97)
MONOCYTES: 10 %
Monocytes Absolute: 0.7 10*3/uL (ref 0.1–0.9)
NEUTROS PCT: 59 %
Neutrophils Absolute: 3.8 10*3/uL (ref 1.4–7.0)
PLATELETS: 174 10*3/uL (ref 150–450)
RBC: 5.92 x10E6/uL — AB (ref 4.14–5.80)
RDW: 12.8 % (ref 12.3–15.4)
WBC: 6.3 10*3/uL (ref 3.4–10.8)

## 2017-12-12 LAB — COMPREHENSIVE METABOLIC PANEL
ALBUMIN: 4.5 g/dL (ref 3.6–4.8)
ALT: 32 IU/L (ref 0–44)
AST: 25 IU/L (ref 0–40)
Albumin/Globulin Ratio: 2 (ref 1.2–2.2)
Alkaline Phosphatase: 61 IU/L (ref 39–117)
BUN/Creatinine Ratio: 19 (ref 10–24)
BUN: 23 mg/dL (ref 8–27)
Bilirubin Total: 0.8 mg/dL (ref 0.0–1.2)
CALCIUM: 10 mg/dL (ref 8.6–10.2)
CO2: 26 mmol/L (ref 20–29)
CREATININE: 1.21 mg/dL (ref 0.76–1.27)
Chloride: 99 mmol/L (ref 96–106)
GFR calc Af Amer: 73 mL/min/{1.73_m2} (ref >59)
GFR calc non Af Amer: 63 mL/min/{1.73_m2} (ref >59)
GLUCOSE: 72 mg/dL (ref 65–99)
Globulin, Total: 2.3 g/dL (ref 1.5–4.5)
Potassium: 3.9 mmol/L (ref 3.5–5.2)
Sodium: 139 mmol/L (ref 134–144)
Total Protein: 6.8 g/dL (ref 6.0–8.5)

## 2017-12-21 ENCOUNTER — Other Ambulatory Visit (HOSPITAL_COMMUNITY): Payer: 59

## 2017-12-21 NOTE — Progress Notes (Signed)
Medical clearance Park Liter, DO 12-11-17 epic   [facility] cardiology Dr Clayborn Bigness 04-10-17 epic   EKG 12-11-17 epic   CT Chest 11-13-17 epic   Cbcdiff, cmp 12-11-17 epic

## 2017-12-24 ENCOUNTER — Ambulatory Visit: Payer: 59 | Admitting: Family Medicine

## 2017-12-25 ENCOUNTER — Encounter (HOSPITAL_COMMUNITY)
Admission: RE | Admit: 2017-12-25 | Discharge: 2017-12-25 | Disposition: A | Discharge status: Home / Self Care | Payer: 59 | Source: Ambulatory Visit | Attending: Orthopedic Surgery | Admitting: Orthopedic Surgery

## 2017-12-25 ENCOUNTER — Encounter (HOSPITAL_COMMUNITY): Payer: Self-pay

## 2017-12-25 ENCOUNTER — Other Ambulatory Visit: Payer: Self-pay

## 2017-12-25 DIAGNOSIS — Z01812 Encounter for preprocedural laboratory examination: Secondary | ICD-10-CM | POA: Diagnosis present

## 2017-12-25 DIAGNOSIS — M1711 Unilateral primary osteoarthritis, right knee: Secondary | ICD-10-CM | POA: Diagnosis not present

## 2017-12-25 HISTORY — DX: Unspecified hearing loss, unspecified ear: H91.90

## 2017-12-25 HISTORY — DX: Tinnitus, bilateral: H93.13

## 2017-12-25 HISTORY — DX: Unspecified osteoarthritis, unspecified site: M19.90

## 2017-12-25 LAB — SURGICAL PCR SCREEN
MRSA, PCR: NEGATIVE
Staphylococcus aureus: NEGATIVE

## 2017-12-25 NOTE — Patient Instructions (Addendum)
Jared Cowan  12/25/2017   Your procedure is scheduled on: 01-01-18   Report to [employer]  Entrance    Report to admitting at 7:30AM    PLEASE BRING CPAP [place]. DEVICE WILL BE PROVIDED!    Call this number if you have problems the morning of surgery [phone]     Remember: Do not eat food or drink liquids :After Midnight. BRUSH YOUR TEETH MORNING OF SURGERY AND RINSE YOUR MOUTH OUT, NO CHEWING GUM CANDY OR MINTS.     Take these medicines the morning of surgery with A SIP OF WATER: rosuvastatin                                  You may not have any metal on your body including hair pins and              piercings  Do not wear jewelry, make-up, lotions, powders or perfumes, deodorant               Men may shave face and neck.   Do not bring valuables to the hospital. [place].  Contacts, dentures or bridgework may not be worn into surgery.  Leave suitcase in the car. After surgery it may be brought to your room.                  Please read over the following fact sheets you were given: _____________________________________________________________________             [facility] - Preparing for Surgery Before surgery, you can play an important role.  Because skin is not sterile, your skin needs to be as free of germs as possible.  You can reduce the number of germs on your skin by washing with CHG (chlorahexidine gluconate) soap before surgery.  CHG is an antiseptic cleaner which kills germs and bonds with the skin to continue killing germs even after washing. Please DO NOT use if you have an allergy to CHG or antibacterial soaps.  If your skin becomes reddened/irritated stop using the CHG and inform your nurse when you arrive at Short Stay. Do not shave (including legs and underarms) for at least 48 hours prior to the first CHG shower.  You may shave your  face/neck. Please follow these instructions carefully:  1.  Shower with CHG Soap the night before surgery and the  morning of Surgery.  2.  If you choose to wash your hair, wash your hair first as usual with your  normal  shampoo.  3.  After you shampoo, rinse your hair and body thoroughly to remove the  shampoo.                           4.  Use CHG as you would any other liquid soap.  You can apply chg directly  to the skin and wash                       Gently with a scrungie or clean washcloth.  5.  Apply the CHG Soap to your body ONLY FROM THE NECK DOWN.   Do not use  on face/ open                           Wound or open sores. Avoid contact with eyes, ears mouth and genitals (private parts).                       Wash face,  Genitals (private parts) with your normal soap.             6.  Wash thoroughly, paying special attention to the area where your surgery  will be performed.  7.  Thoroughly rinse your body with warm water from the neck down.  8.  DO NOT shower/wash with your normal soap after using and rinsing off  the CHG Soap.                9.  Pat yourself dry with a clean towel.            10.  Wear clean pajamas.            11.  Place clean sheets on your bed the night of your first shower and do not  sleep with pets. Day of Surgery : Do not apply any lotions/deodorants the morning of surgery.  Please wear clean clothes to the hospital/surgery center.  FAILURE TO FOLLOW THESE INSTRUCTIONS MAY RESULT IN THE CANCELLATION OF YOUR SURGERY PATIENT SIGNATURE_________________________________  NURSE SIGNATURE__________________________________  ________________________________________________________________________   Jared Cowan  An incentive spirometer is a tool that can help keep your lungs clear and active. This tool measures how well you are filling your lungs with each breath. Taking long deep breaths may help reverse or decrease the chance of developing breathing  (pulmonary) problems (especially infection) following:  A long period of time when you are unable to move or be active. BEFORE THE PROCEDURE   If the spirometer includes an indicator to show your best effort, your nurse or respiratory therapist will set it to a desired goal.  If possible, sit up straight or lean slightly forward. Try not to slouch.  Hold the incentive spirometer in an upright position. INSTRUCTIONS FOR USE  1. Sit on the edge of your bed if possible, or sit up as far as you can in bed or on a chair. 2. Hold the incentive spirometer in an upright position. 3. Breathe out normally. 4. Place the mouthpiece in your mouth and seal your lips tightly around it. 5. Breathe in slowly and as deeply as possible, raising the piston or the ball toward the top of the column. 6. Hold your breath for 3-5 seconds or for as long as possible. Allow the piston or ball to fall to the bottom of the column. 7. Remove the mouthpiece from your mouth and breathe out normally. 8. Rest for a few seconds and repeat Steps 1 through 7 at least 10 times every 1-2 hours when you are awake. Take your time and take a few normal breaths between deep breaths. 9. The spirometer may include an indicator to show your best effort. Use the indicator as a goal to work toward during each repetition. 10. After each set of 10 deep breaths, practice coughing to be sure your lungs are clear. If you have an incision (the cut made at the time of surgery), support your incision when coughing by placing a pillow or rolled up towels firmly against it. Once you are able to get out of bed, walk around  indoors and cough well. You may stop using the incentive spirometer when instructed by your caregiver.  RISKS AND COMPLICATIONS  Take your time so you do not get dizzy or light-headed.  If you are in pain, you may need to take or ask for pain medication before doing incentive spirometry. It is harder to take a deep breath if you  are having pain. AFTER USE  Rest and breathe slowly and easily.  It can be helpful to keep track of a log of your progress. Your caregiver can provide you with a simple table to help with this. If you are using the spirometer at home, follow these instructions: [place] IF:   You are having difficultly using the spirometer.  You have trouble using the spirometer as often as instructed.  Your pain medication is not giving enough relief while using the spirometer.  You develop fever of 100.5 F (38.1 C) or higher. SEEK IMMEDIATE MEDICAL CARE IF:   You cough up bloody sputum that had not been present before.  You develop fever of 102 F (38.9 C) or greater.  You develop worsening pain at or near the incision site. MAKE SURE YOU:   Understand these instructions.  Will watch your condition.  Will get help right away if you are not doing well or get worse. Document Released: 05/29/2006 Document Revised: 04/10/2011 Document Reviewed: 07/30/2006 [facility], [facility], [facility] Patient Information 2014 [place], [place].   ________________________________________________________________________

## 2017-12-29 ENCOUNTER — Encounter: Payer: Self-pay | Admitting: Family Medicine

## 2017-12-29 DIAGNOSIS — E669 Obesity, unspecified: Secondary | ICD-10-CM

## 2017-12-31 MED ORDER — BUPIVACAINE LIPOSOME 1.3 % IJ SUSP
20.0000 mL | INTRAMUSCULAR | Status: DC
  Filled 2017-12-31: qty 20

## 2018-01-01 ENCOUNTER — Inpatient Hospital Stay (HOSPITAL_COMMUNITY): Payer: 59 | Admitting: Anesthesiology

## 2018-01-01 ENCOUNTER — Encounter (HOSPITAL_COMMUNITY)
Admission: RE | Disposition: A | Discharge status: Home Health | Payer: Self-pay | Source: Home / Self Care | Attending: Orthopedic Surgery

## 2018-01-01 ENCOUNTER — Inpatient Hospital Stay (HOSPITAL_COMMUNITY)
Admission: RE | Admit: 2018-01-01 | Discharge: 2018-01-02 | DRG: 470 | Disposition: A | Discharge status: Home Health | Payer: 59 | Attending: Orthopedic Surgery | Admitting: Orthopedic Surgery

## 2018-01-01 ENCOUNTER — Observation Stay (HOSPITAL_COMMUNITY): Payer: 59

## 2018-01-01 ENCOUNTER — Other Ambulatory Visit: Payer: Self-pay

## 2018-01-01 ENCOUNTER — Encounter (HOSPITAL_COMMUNITY): Payer: Self-pay | Admitting: Anesthesiology

## 2018-01-01 DIAGNOSIS — G4733 Obstructive sleep apnea (adult) (pediatric): Secondary | ICD-10-CM | POA: Diagnosis present

## 2018-01-01 DIAGNOSIS — Z79890 Hormone replacement therapy (postmenopausal): Secondary | ICD-10-CM

## 2018-01-01 DIAGNOSIS — Z9049 Acquired absence of other specified parts of digestive tract: Secondary | ICD-10-CM | POA: Diagnosis not present

## 2018-01-01 DIAGNOSIS — E291 Testicular hypofunction: Secondary | ICD-10-CM | POA: Diagnosis present

## 2018-01-01 DIAGNOSIS — Z8249 Family history of ischemic heart disease and other diseases of the circulatory system: Secondary | ICD-10-CM | POA: Diagnosis not present

## 2018-01-01 DIAGNOSIS — I10 Essential (primary) hypertension: Secondary | ICD-10-CM | POA: Diagnosis present

## 2018-01-01 DIAGNOSIS — Z79899 Other long term (current) drug therapy: Secondary | ICD-10-CM | POA: Diagnosis not present

## 2018-01-01 DIAGNOSIS — D751 Secondary polycythemia: Secondary | ICD-10-CM | POA: Diagnosis present

## 2018-01-01 DIAGNOSIS — Z809 Family history of malignant neoplasm, unspecified: Secondary | ICD-10-CM

## 2018-01-01 DIAGNOSIS — C8303 Small cell B-cell lymphoma, intra-abdominal lymph nodes: Secondary | ICD-10-CM

## 2018-01-01 DIAGNOSIS — Z87891 Personal history of nicotine dependence: Secondary | ICD-10-CM | POA: Diagnosis not present

## 2018-01-01 DIAGNOSIS — E785 Hyperlipidemia, unspecified: Secondary | ICD-10-CM | POA: Diagnosis present

## 2018-01-01 DIAGNOSIS — M1711 Unilateral primary osteoarthritis, right knee: Secondary | ICD-10-CM | POA: Diagnosis present

## 2018-01-01 DIAGNOSIS — C8300 Small cell B-cell lymphoma, unspecified site: Secondary | ICD-10-CM | POA: Diagnosis present

## 2018-01-01 DIAGNOSIS — D696 Thrombocytopenia, unspecified: Secondary | ICD-10-CM | POA: Diagnosis present

## 2018-01-01 DIAGNOSIS — E782 Mixed hyperlipidemia: Secondary | ICD-10-CM

## 2018-01-01 DIAGNOSIS — Z91048 Other nonmedicinal substance allergy status: Secondary | ICD-10-CM

## 2018-01-01 DIAGNOSIS — M1710 Unilateral primary osteoarthritis, unspecified knee: Secondary | ICD-10-CM | POA: Diagnosis present

## 2018-01-01 DIAGNOSIS — Z96659 Presence of unspecified artificial knee joint: Secondary | ICD-10-CM

## 2018-01-01 HISTORY — PX: TOTAL KNEE ARTHROPLASTY: SHX125

## 2018-01-01 SURGERY — ARTHROPLASTY, KNEE, TOTAL
Anesthesia: Regional | Site: Knee | Laterality: Right

## 2018-01-01 MED ORDER — POVIDONE-IODINE 10 % EX SWAB
2.0000 "application " | Freq: Once | CUTANEOUS | Status: AC
  Administered 2018-01-01: 2 via TOPICAL

## 2018-01-01 MED ORDER — ROSUVASTATIN CALCIUM 10 MG PO TABS
10.0000 mg | ORAL_TABLET | Freq: Every day | ORAL | Status: DC
  Administered 2018-01-01 – 2018-01-02 (×2): 10 mg via ORAL
  Filled 2018-01-01 (×2): qty 1

## 2018-01-01 MED ORDER — GABAPENTIN 300 MG PO CAPS
300.0000 mg | ORAL_CAPSULE | Freq: Three times a day (TID) | ORAL | Status: DC
  Administered 2018-01-01 – 2018-01-02 (×3): 300 mg via ORAL
  Filled 2018-01-01 (×3): qty 1

## 2018-01-01 MED ORDER — METOCLOPRAMIDE HCL 5 MG PO TABS: 5.0000 mg | ORAL_TABLET | Freq: Three times a day (TID) | ORAL | Status: DC | PRN

## 2018-01-01 MED ORDER — METHOCARBAMOL 750 MG PO TABS: 750.0000 mg | ORAL_TABLET | Freq: Four times a day (QID) | ORAL | 0 refills | Status: AC | PRN

## 2018-01-01 MED ORDER — CEFAZOLIN SODIUM-DEXTROSE 2-4 GM/100ML-% IV SOLN
2.0000 g | INTRAVENOUS | Status: AC
  Administered 2018-01-01: 2 g via INTRAVENOUS
  Filled 2018-01-01: qty 100

## 2018-01-01 MED ORDER — METOCLOPRAMIDE HCL 5 MG/ML IJ SOLN: 5.0000 mg | Freq: Three times a day (TID) | INTRAMUSCULAR | Status: DC | PRN

## 2018-01-01 MED ORDER — DOCUSATE SODIUM 100 MG PO CAPS: 100.0000 mg | ORAL_CAPSULE | Freq: Two times a day (BID) | ORAL | 0 refills | Status: AC

## 2018-01-01 MED ORDER — PROPOFOL 500 MG/50ML IV EMUL
INTRAVENOUS | Status: DC | PRN
  Administered 2018-01-01: 75 ug/kg/min via INTRAVENOUS

## 2018-01-01 MED ORDER — CHLORHEXIDINE GLUCONATE 4 % EX LIQD: 60.0000 mL | Freq: Once | CUTANEOUS | Status: DC

## 2018-01-01 MED ORDER — METHOCARBAMOL 500 MG PO TABS
500.0000 mg | ORAL_TABLET | Freq: Four times a day (QID) | ORAL | Status: DC | PRN
  Administered 2018-01-01 – 2018-01-02 (×3): 500 mg via ORAL
  Filled 2018-01-01 (×3): qty 1

## 2018-01-01 MED ORDER — DEXAMETHASONE SODIUM PHOSPHATE 10 MG/ML IJ SOLN
10.0000 mg | Freq: Once | INTRAMUSCULAR | Status: AC
  Administered 2018-01-02: 10 mg via INTRAVENOUS
  Filled 2018-01-01: qty 1

## 2018-01-01 MED ORDER — ONDANSETRON HCL 4 MG/2ML IJ SOLN: 4.0000 mg | Freq: Four times a day (QID) | INTRAMUSCULAR | Status: DC | PRN

## 2018-01-01 MED ORDER — 0.9 % SODIUM CHLORIDE (POUR BTL) OPTIME
TOPICAL | Status: DC | PRN
  Administered 2018-01-01: 1000 mL

## 2018-01-01 MED ORDER — SODIUM CHLORIDE 0.9 % IR SOLN
Status: DC | PRN
  Administered 2018-01-01: 1000 mL

## 2018-01-01 MED ORDER — METHOCARBAMOL 500 MG IVPB - SIMPLE MED
500.0000 mg | Freq: Four times a day (QID) | INTRAVENOUS | Status: DC | PRN
  Filled 2018-01-01: qty 50

## 2018-01-01 MED ORDER — LACTATED RINGERS IV SOLN
INTRAVENOUS | Status: DC
  Administered 2018-01-01 – 2018-01-02 (×3): via INTRAVENOUS

## 2018-01-01 MED ORDER — RIVAROXABAN 10 MG PO TABS
10.0000 mg | ORAL_TABLET | Freq: Every day | ORAL | Status: DC
  Administered 2018-01-02: 10 mg via ORAL
  Filled 2018-01-01: qty 1

## 2018-01-01 MED ORDER — ONDANSETRON HCL 4 MG PO TABS: 4.0000 mg | ORAL_TABLET | Freq: Four times a day (QID) | ORAL | Status: DC | PRN

## 2018-01-01 MED ORDER — POLYETHYLENE GLYCOL 3350 17 G PO PACK: 17.0000 g | PACK | Freq: Every day | ORAL | Status: DC | PRN

## 2018-01-01 MED ORDER — FAMOTIDINE 20 MG PO TABS
20.0000 mg | ORAL_TABLET | Freq: Two times a day (BID) | ORAL | Status: DC
  Administered 2018-01-01 – 2018-01-02 (×2): 20 mg via ORAL
  Filled 2018-01-01 (×2): qty 1

## 2018-01-01 MED ORDER — PHENYLEPHRINE 40 MCG/ML (10ML) SYRINGE FOR IV PUSH (FOR BLOOD PRESSURE SUPPORT)
PREFILLED_SYRINGE | INTRAVENOUS | Status: DC | PRN
  Administered 2018-01-01 (×4): 80 ug via INTRAVENOUS

## 2018-01-01 MED ORDER — SODIUM CHLORIDE 0.9% FLUSH
INTRAVENOUS | Status: DC | PRN
  Administered 2018-01-01: 30 mL

## 2018-01-01 MED ORDER — PROPOFOL 10 MG/ML IV BOLUS
INTRAVENOUS | Status: AC
  Filled 2018-01-01: qty 20

## 2018-01-01 MED ORDER — ONDANSETRON HCL 4 MG PO TABS: 4.0000 mg | ORAL_TABLET | Freq: Three times a day (TID) | ORAL | 0 refills | Status: AC | PRN

## 2018-01-01 MED ORDER — GABAPENTIN 300 MG PO CAPS
300.0000 mg | ORAL_CAPSULE | Freq: Once | ORAL | Status: AC
  Administered 2018-01-01: 300 mg via ORAL
  Filled 2018-01-01: qty 1

## 2018-01-01 MED ORDER — DOCUSATE SODIUM 100 MG PO CAPS
100.0000 mg | ORAL_CAPSULE | Freq: Two times a day (BID) | ORAL | Status: DC
  Administered 2018-01-01 – 2018-01-02 (×2): 100 mg via ORAL
  Filled 2018-01-01 (×2): qty 1

## 2018-01-01 MED ORDER — FENTANYL CITRATE (PF) 100 MCG/2ML IJ SOLN
50.0000 ug | INTRAMUSCULAR | Status: AC
  Administered 2018-01-01: 50 ug via INTRAVENOUS
  Filled 2018-01-01: qty 2

## 2018-01-01 MED ORDER — OXYCODONE HCL 5 MG PO TABS
5.0000 mg | ORAL_TABLET | ORAL | Status: DC | PRN
  Administered 2018-01-01 – 2018-01-02 (×5): 10 mg via ORAL
  Filled 2018-01-01 (×5): qty 2

## 2018-01-01 MED ORDER — MENTHOL 3 MG MT LOZG: 1.0000 | LOZENGE | OROMUCOSAL | Status: DC | PRN

## 2018-01-01 MED ORDER — TRANEXAMIC ACID-NACL 1000-0.7 MG/100ML-% IV SOLN
1000.0000 mg | INTRAVENOUS | Status: AC
  Administered 2018-01-01: 1000 mg via INTRAVENOUS
  Filled 2018-01-01: qty 100

## 2018-01-01 MED ORDER — ACETAMINOPHEN 500 MG PO TABS
1000.0000 mg | ORAL_TABLET | Freq: Three times a day (TID) | ORAL | Status: DC
  Administered 2018-01-01 – 2018-01-02 (×3): 1000 mg via ORAL
  Filled 2018-01-01 (×3): qty 2

## 2018-01-01 MED ORDER — PHENYLEPHRINE 40 MCG/ML (10ML) SYRINGE FOR IV PUSH (FOR BLOOD PRESSURE SUPPORT)
PREFILLED_SYRINGE | INTRAVENOUS | Status: AC
  Filled 2018-01-01: qty 10

## 2018-01-01 MED ORDER — OXYCODONE HCL 5 MG PO TABS: 5.0000 mg | ORAL_TABLET | ORAL | 0 refills | Status: AC | PRN

## 2018-01-01 MED ORDER — BENAZEPRIL HCL 10 MG PO TABS
30.0000 mg | ORAL_TABLET | Freq: Every day | ORAL | Status: DC
  Administered 2018-01-01 – 2018-01-02 (×2): 30 mg via ORAL
  Filled 2018-01-01 (×2): qty 3

## 2018-01-01 MED ORDER — KETOROLAC TROMETHAMINE 15 MG/ML IJ SOLN
15.0000 mg | Freq: Four times a day (QID) | INTRAMUSCULAR | Status: AC
  Administered 2018-01-01 – 2018-01-02 (×4): 15 mg via INTRAVENOUS
  Filled 2018-01-01 (×4): qty 1

## 2018-01-01 MED ORDER — BUPIVACAINE LIPOSOME 1.3 % IJ SUSP
INTRAMUSCULAR | Status: DC | PRN
  Administered 2018-01-01: 20 mL

## 2018-01-01 MED ORDER — HYDROMORPHONE HCL 1 MG/ML IJ SOLN: 0.5000 mg | INTRAMUSCULAR | Status: DC | PRN

## 2018-01-01 MED ORDER — PHENOL 1.4 % MT LIQD: 1.0000 | OROMUCOSAL | Status: DC | PRN

## 2018-01-01 MED ORDER — MIDAZOLAM HCL 2 MG/2ML IJ SOLN
INTRAMUSCULAR | Status: AC
  Filled 2018-01-01: qty 2

## 2018-01-01 MED ORDER — GABAPENTIN 300 MG PO CAPS: 300.0000 mg | ORAL_CAPSULE | Freq: Two times a day (BID) | ORAL | 0 refills | Status: AC

## 2018-01-01 MED ORDER — DEXAMETHASONE SODIUM PHOSPHATE 10 MG/ML IJ SOLN
INTRAMUSCULAR | Status: DC | PRN
  Administered 2018-01-01: 10 mg via INTRAVENOUS

## 2018-01-01 MED ORDER — SORBITOL 70 % SOLN
30.0000 mL | Freq: Every day | Status: DC | PRN
  Filled 2018-01-01: qty 30

## 2018-01-01 MED ORDER — ONDANSETRON HCL 4 MG/2ML IJ SOLN
INTRAMUSCULAR | Status: DC | PRN
  Administered 2018-01-01: 4 mg via INTRAVENOUS

## 2018-01-01 MED ORDER — MAGNESIUM CITRATE PO SOLN: 1.0000 | Freq: Once | ORAL | Status: DC | PRN

## 2018-01-01 MED ORDER — RIVAROXABAN 10 MG PO TABS: 10.0000 mg | ORAL_TABLET | Freq: Every day | ORAL | 0 refills | Status: AC

## 2018-01-01 MED ORDER — LACTATED RINGERS IV SOLN
INTRAVENOUS | Status: DC
  Administered 2018-01-01 (×2): via INTRAVENOUS

## 2018-01-01 MED ORDER — PROPOFOL 10 MG/ML IV BOLUS
INTRAVENOUS | Status: DC | PRN
  Administered 2018-01-01: 10 mg via INTRAVENOUS
  Administered 2018-01-01: 30 mg via INTRAVENOUS

## 2018-01-01 MED ORDER — PROPOFOL 10 MG/ML IV BOLUS
INTRAVENOUS | Status: AC
  Filled 2018-01-01: qty 60

## 2018-01-01 MED ORDER — MIDAZOLAM HCL 2 MG/2ML IJ SOLN
1.0000 mg | INTRAMUSCULAR | Status: AC
  Administered 2018-01-01: 2 mg via INTRAVENOUS
  Filled 2018-01-01: qty 2

## 2018-01-01 MED ORDER — HYDROCHLOROTHIAZIDE 25 MG PO TABS
25.0000 mg | ORAL_TABLET | Freq: Every day | ORAL | Status: DC
  Administered 2018-01-01 – 2018-01-02 (×2): 25 mg via ORAL
  Filled 2018-01-01 (×2): qty 1

## 2018-01-01 MED ORDER — CEFAZOLIN SODIUM-DEXTROSE 2-4 GM/100ML-% IV SOLN
2.0000 g | Freq: Four times a day (QID) | INTRAVENOUS | Status: AC
  Administered 2018-01-01 (×2): 2 g via INTRAVENOUS
  Filled 2018-01-01 (×2): qty 100

## 2018-01-01 MED ORDER — FENTANYL CITRATE (PF) 100 MCG/2ML IJ SOLN: 25.0000 ug | INTRAMUSCULAR | Status: DC | PRN

## 2018-01-01 MED ORDER — MIDAZOLAM HCL 5 MG/5ML IJ SOLN
INTRAMUSCULAR | Status: DC | PRN
  Administered 2018-01-01: 2 mg via INTRAVENOUS

## 2018-01-01 MED ORDER — DIPHENHYDRAMINE HCL 12.5 MG/5ML PO ELIX: 12.5000 mg | ORAL_SOLUTION | ORAL | Status: DC | PRN

## 2018-01-01 MED ORDER — ACETAMINOPHEN 500 MG PO TABS
1000.0000 mg | ORAL_TABLET | Freq: Once | ORAL | Status: AC
  Administered 2018-01-01: 1000 mg via ORAL
  Filled 2018-01-01: qty 2

## 2018-01-01 MED ORDER — ACETAMINOPHEN 500 MG PO TABS: 1000.0000 mg | ORAL_TABLET | Freq: Three times a day (TID) | ORAL | 0 refills | Status: AC

## 2018-01-01 MED ORDER — SODIUM CHLORIDE (PF) 0.9 % IJ SOLN
INTRAMUSCULAR | Status: AC
  Filled 2018-01-01: qty 50

## 2018-01-01 MED ORDER — BUPIVACAINE IN DEXTROSE 0.75-8.25 % IT SOLN
INTRATHECAL | Status: DC | PRN
  Administered 2018-01-01: 1.6 mL via INTRATHECAL

## 2018-01-01 MED ORDER — ACETAMINOPHEN 325 MG PO TABS
325.0000 mg | ORAL_TABLET | Freq: Four times a day (QID) | ORAL | Status: DC | PRN
  Administered 2018-01-01: 650 mg via ORAL
  Filled 2018-01-01: qty 2

## 2018-01-01 SURGICAL SUPPLY — 49 items
BANDAGE ACE 6X5 VEL STRL LF (GAUZE/BANDAGES/DRESSINGS) ×2 IMPLANT
BANDAGE ELASTIC 6 VELCRO ST LF (GAUZE/BANDAGES/DRESSINGS) ×2 IMPLANT
BEARIN INSERT TIBIAL SZ6 9 (Orthopedic Implant) ×2 IMPLANT
BEARING INSERT TIBIAL SZ6 9 (Orthopedic Implant) ×1 IMPLANT
BLADE HEX COATED 2.75 (ELECTRODE) ×2 IMPLANT
BLADE SAG 18X100X1.27 (BLADE) ×2 IMPLANT
BLADE SAGITTAL 25.0X1.37X90 (BLADE) ×2 IMPLANT
BLADE SURG 15 STRL LF DISP TIS (BLADE) ×1 IMPLANT
BLADE SURG 15 STRL SS (BLADE) ×1
BOWL SMART MIX CTS (DISPOSABLE) IMPLANT
CLSR STERI-STRIP ANTIMIC 1/2X4 (GAUZE/BANDAGES/DRESSINGS) ×2 IMPLANT
COVER SURGICAL LIGHT HANDLE (MISCELLANEOUS) ×2 IMPLANT
COVER WAND RF STERILE (DRAPES) IMPLANT
CUFF TOURN SGL QUICK 34 (TOURNIQUET CUFF) ×1
CUFF TRNQT CYL 34X4X40X1 (TOURNIQUET CUFF) ×1 IMPLANT
DECANTER SPIKE VIAL GLASS SM (MISCELLANEOUS) IMPLANT
DRAPE U-SHAPE 47X51 STRL (DRAPES) ×2 IMPLANT
DRSG MEPILEX BORDER 4X12 (GAUZE/BANDAGES/DRESSINGS) ×2 IMPLANT
DRSG MEPILEX BORDER 4X8 (GAUZE/BANDAGES/DRESSINGS) ×2 IMPLANT
DURAPREP 26ML APPLICATOR (WOUND CARE) ×4 IMPLANT
FEMORAL POSTERIOR SZ5 RT (Femur) ×1 IMPLANT
GLOVE BIO SURGEON STRL SZ7.5 (GLOVE) ×4 IMPLANT
GLOVE BIOGEL PI IND STRL 8 (GLOVE) ×2 IMPLANT
GLOVE BIOGEL PI INDICATOR 8 (GLOVE) ×2
GOWN STRL REUS W/ TWL LRG LVL3 (GOWN DISPOSABLE) ×2 IMPLANT
GOWN STRL REUS W/TWL LRG LVL3 (GOWN DISPOSABLE) ×2
HANDPIECE INTERPULSE COAX TIP (DISPOSABLE)
HOLDER FOLEY CATH W/STRAP (MISCELLANEOUS) IMPLANT
IMMOBILIZER KNEE 20 (SOFTGOODS) ×2 IMPLANT
IMMOBILIZER KNEE 22 UNIV (SOFTGOODS) ×2 IMPLANT
KNEE PATELLA ASYMMETRIC 10X32 (Knees) ×2 IMPLANT
KNEE TIBIAL COMPONENT SZ6 (Knees) ×2 IMPLANT
MANIFOLD NEPTUNE II (INSTRUMENTS) ×2 IMPLANT
NS IRRIG 1000ML POUR BTL (IV SOLUTION) ×2 IMPLANT
PACK TOTAL KNEE CUSTOM (KITS) ×2 IMPLANT
POSTERIOR FEMORAL SZ5 RT (Femur) ×2 IMPLANT
PROTECTOR NERVE ULNAR (MISCELLANEOUS) ×2 IMPLANT
SET HNDPC FAN SPRY TIP SCT (DISPOSABLE) IMPLANT
STRIP CLOSURE SKIN 1/2X4 (GAUZE/BANDAGES/DRESSINGS) ×2 IMPLANT
SUT MNCRL AB 4-0 PS2 18 (SUTURE) ×2 IMPLANT
SUT VIC AB 0 CT1 27 (SUTURE) ×1
SUT VIC AB 0 CT1 27XBRD ANBCTR (SUTURE) ×1 IMPLANT
SUT VIC AB 1 CT1 36 (SUTURE) ×4 IMPLANT
SUT VIC AB 2-0 CT1 27 (SUTURE) ×1
SUT VIC AB 2-0 CT1 TAPERPNT 27 (SUTURE) ×1 IMPLANT
TRAY FOLEY MTR SLVR 14FR STAT (SET/KITS/TRAYS/PACK) IMPLANT
TRAY FOLEY MTR SLVR 16FR STAT (SET/KITS/TRAYS/PACK) ×2 IMPLANT
WRAP KNEE MAXI GEL POST OP (GAUZE/BANDAGES/DRESSINGS) ×2 IMPLANT
YANKAUER SUCT BULB TIP 10FT TU (MISCELLANEOUS) ×2 IMPLANT

## 2018-01-01 NOTE — Anesthesia Procedure Notes (Signed)
Spinal  Patient location during procedure: OR Start time: 01/01/2018 10:03 AM End time: 01/01/2018 10:12 AM Staffing Resident/CRNA: Lavina Hamman, CRNA Performed: resident/CRNA  Preanesthetic Checklist Completed: patient identified, site marked, surgical consent, pre-op evaluation, timeout performed, IV checked, risks and benefits discussed and monitors and equipment checked Spinal Block Patient position: sitting Prep: DuraPrep Patient monitoring: heart rate, cardiac monitor, continuous pulse ox and blood pressure Approach: midline Location: L4-5 Injection technique: single-shot Needle Needle type: Spinocan and Pencan  Needle gauge: 24 G Needle length: 9 cm Needle insertion depth: 7 cm Assessment Sensory level: T6 Additional Notes -heme, -para, VSS.  Pt tolerated well.  Lot and expiration date OK.

## 2018-01-01 NOTE — Interval H&P Note (Signed)
I participated in the care of this patient and agree with the above history, physical and evaluation. I performed a review of the history and a physical exam as detailed   Kyndle Schlender Daniel Karington Zarazua MD  

## 2018-01-01 NOTE — Progress Notes (Signed)
Assisted Dr. Woodrum with right, ultrasound guided, adductor canal block. Side rails up, monitors on throughout procedure. See vital signs in flow sheet. Tolerated Procedure well.  

## 2018-01-01 NOTE — Interval H&P Note (Signed)
I participated in the care of this patient and agree with the above history, physical and evaluation. I performed a review of the history and a physical exam as detailed   Tomicka Lover Daniel Antoniette Peake MD  

## 2018-01-01 NOTE — Anesthesia Postprocedure Evaluation (Signed)
Anesthesia Post Note  Patient: Jared Cowan  Procedure(s) Performed: RIGHT TOTAL KNEE ARTHROPLASTY (Right Knee)     Patient location during evaluation: PACU Anesthesia Type: Regional and Spinal Level of consciousness: oriented and awake and alert Pain management: pain level controlled Vital Signs Assessment: post-procedure vital signs reviewed and stable Respiratory status: spontaneous breathing, respiratory function stable and patient connected to nasal cannula oxygen Cardiovascular status: blood pressure returned to baseline and stable Postop Assessment: no headache, no backache and no apparent nausea or vomiting Anesthetic complications: no    Last Vitals:  Vitals:   01/01/18 1556 01/01/18 1706  BP: (!) 126/91 122/75  Pulse: (!) 54 (!) 57  Resp: 16 16  Temp: 36.4 C   SpO2: 98% 96%    Last Pain:  Vitals:   01/01/18 1615  TempSrc:   PainSc: 3                  [name] [name]

## 2018-01-01 NOTE — Transfer of Care (Signed)
Immediate Anesthesia Transfer of Care Note  Patient: Jared Cowan  Procedure(s) Performed: Procedure(s): RIGHT TOTAL KNEE ARTHROPLASTY (Right)  Patient Location: PACU  Anesthesia Type:Spinal  Level of Consciousness:  sedated, patient cooperative and responds to stimulation  Airway & Oxygen Therapy:Patient Spontanous Breathing and Patient connected to face mask oxgen  Post-op Assessment:  Report given to PACU RN and Post -op Vital signs reviewed and stable  Post vital signs:  Reviewed and stable  Last Vitals:  Vitals:   01/01/18 0953 01/01/18 0957  BP:  128/78  Pulse: 65 (!) 56  Resp: 11 13  Temp:    SpO2: 16% 10%    Complications: No apparent anesthesia complications

## 2018-01-01 NOTE — Anesthesia Procedure Notes (Signed)
Anesthesia Regional Block: Adductor canal block   Pre-Anesthetic Checklist: ,, timeout performed, Correct Patient, Correct Site, Correct Laterality, Correct Procedure, Correct Position, site marked, Risks and benefits discussed,  Surgical consent,  Pre-op evaluation,  At surgeon's request and post-op pain management  Laterality: Right  Prep: Maximum Sterile Barrier Precautions used, chloraprep       Needles:  Injection technique: Single-shot  Needle Type: Echogenic Stimulator Needle     Needle Length: 9cm  Needle Gauge: 22     Additional Needles:   Procedures:,,,, ultrasound used (permanent image in chart),,,,  Narrative:  Start time: 01/01/2018 9:07 AM End time: 01/01/2018 9:17 AM Injection made incrementally with aspirations every 5 mL.  Performed by: Personally  Anesthesiologist: Freddrick March, MD  Additional Notes: Monitors applied. No increased pain on injection. No increased resistance to injection. Injection made in 5cc increments. Good needle visualization. Patient tolerated procedure well.

## 2018-01-01 NOTE — Plan of Care (Signed)
Pt stable with no needs. No changes to note. Pt able to walk in hall with walker, gait belt, and two person assist. No s/s of distress. Medicating for pain. Will continue to monitor.

## 2018-01-01 NOTE — Anesthesia Preprocedure Evaluation (Addendum)
Anesthesia Evaluation  Patient identified by MRN, date of birth, ID band Patient awake    Reviewed: Allergy & Precautions, NPO status , Patient's Chart, lab work & pertinent test results  Airway Mallampati: I  TM Distance: >3 FB Neck ROM: Full    Dental no notable dental hx. (+) Edentulous Upper,    Pulmonary sleep apnea and Continuous Positive Airway Pressure Ventilation , former smoker,    Pulmonary exam normal breath sounds clear to auscultation       Cardiovascular hypertension, Pt. on medications + Peripheral Vascular Disease  Normal cardiovascular exam Rhythm:Regular Rate:Normal  LHC 2018 Prox RCA to Dist RCA lesion, 15 %stenosed. Prox LAD to Dist LAD lesion, 10 %stenosed. The left ventricular systolic function is normal. LV end diastolic pressure is normal. The left ventricular ejection fraction is 55-65% by visual estimate. No significant coronary disease   Conclusion No significant obstructive coronary disease Normal left ventricular function Noncardiac chest pain   Neuro/Psych negative neurological ROS  negative psych ROS   GI/Hepatic negative GI ROS, Neg liver ROS,   Endo/Other  negative endocrine ROS  Renal/GU negative Renal ROS  negative genitourinary   Musculoskeletal negative musculoskeletal ROS (+) Arthritis ,   Abdominal   Peds  Hematology negative hematology ROS (+) Small lymphocytic lymphoma   Anesthesia Other Findings   Reproductive/Obstetrics                            Anesthesia Physical Anesthesia Plan  ASA: III  Anesthesia Plan: Regional and Spinal   Post-op Pain Management:    Induction: Intravenous  PONV Risk Score and Plan: 1  Airway Management Planned: Natural Airway and Simple Face Mask  Additional Equipment:   Intra-op Plan:   Post-operative Plan:   Informed Consent: I have reviewed the patients History and Physical, chart, labs and  discussed the procedure including the risks, benefits and alternatives for the proposed anesthesia with the patient or authorized representative who has indicated his/her understanding and acceptance.   Dental advisory given  Plan Discussed with: CRNA  Anesthesia Plan Comments:         Anesthesia Quick Evaluation

## 2018-01-01 NOTE — Progress Notes (Signed)
At [place], Jared Cowan was paged regarding the pt's request for Pepcid.

## 2018-01-01 NOTE — Op Note (Signed)
DATE OF SURGERY:  01/01/2018 TIME: 11:32 AM  PATIENT NAME:  Jared Cowan   AGE: 62 y.o.    PRE-OPERATIVE DIAGNOSIS:  OA RIGHT KNEE  POST-OPERATIVE DIAGNOSIS:  Same  PROCEDURE:  Procedure(s): RIGHT TOTAL KNEE ARTHROPLASTY   SURGEON:  Renette Butters, MD   ASSISTANT:  Roxan Hockey, PA-C, he was present and scrubbed throughout the case, critical for completion in a timely fashion, and for retraction, instrumentation, and closure.    OPERATIVE IMPLANTS: [employer]ker Triathlon Posterior Stabilized. Press fit knee  Femur size 5, Tibia size 6, Patella size 32 3-peg oval button, with a 9 mm polyethylene insert.   PREOPERATIVE INDICATIONS:  Jared Cowan is a 62 y.o. year old male with end stage bone on bone degenerative arthritis of the knee who failed conservative treatment, including injections, antiinflammatories, activity modification, and assistive devices, and had significant impairment of their activities of daily living, and elected for Total Knee Arthroplasty.   The risks, benefits, and alternatives were discussed at length including but not limited to the risks of infection, bleeding, nerve injury, stiffness, blood clots, the need for revision surgery, cardiopulmonary complications, among others, and they were willing to proceed.   OPERATIVE DESCRIPTION:  The patient was brought to the operative room and placed in a supine position.  General anesthesia was administered.  IV antibiotics were given.  The lower extremity was prepped and draped in the usual sterile fashion.  Time out was performed.  The leg was elevated and exsanguinated and the tourniquet was inflated.  Anterior approach was performed.  The patella was everted and osteophytes were removed.  The anterior horn of the medial and lateral meniscus was removed.   The distal femur was opened with the drill and the intramedullary distal femoral cutting jig was utilized, set at 5 degrees resecting 10 mm off the distal  femur.  Care was taken to protect the collateral ligaments.  The distal femoral sizing jig was applied, taking care to avoid notching.  Then the 4-in-1 cutting jig was applied and the anterior and posterior femur was cut, along with the chamfer cuts.  All posterior osteophytes were removed.  The flexion gap was then measured and was symmetric with the extension gap.  Then the extramedullary tibial cutting jig was utilized making the appropriate cut using the anterior tibial crest as a reference building in appropriate posterior slope.  Care was taken during the cut to protect the medial and collateral ligaments.  The proximal tibia was removed along with the posterior horns of the menisci.  The PCL was sacrificed.    The extensor gap was measured and was approximately 12mm.    I completed the distal femoral preparation using the appropriate jig to prepare the box.  The patella was then measured, and cut with the saw.    The proximal tibia sized and prepared accordingly with the reamer and the punch, and then all components were trialed with the above sized poly insert.  The knee was found to have excellent balance and full motion.    The above named components were then impacted into place and Poly tibial piece and patella were inserted.  I was very happy with his stability and ROM  I performed a periarticular injection with marcaine and toradol  The knee was easily taken through a range of motion and the patella tracked well and the knee irrigated copiously and the parapatellar and subcutaneous tissue closed with vicryl, and monocryl with steri strips for the skin.  The incision was dressed with sterile gauze and the tourniquet released and the patient was awakened and returned to the PACU in stable and satisfactory condition.  There were no complications.  Total tourniquet time was roughly 60 minutes.   POSTOPERATIVE PLAN: post op Abx, DVT px: SCD's, TED's, Early ambulation and chemical  px

## 2018-01-01 NOTE — Evaluation (Signed)
Physical Therapy Evaluation Patient Details Name: Jared Cowan MRN: [id number] DOB: [birth year] Today's Date: 01/01/2018   History of Present Illness  62 yo male s/p R TKR on 01/01/18. PMH includes small lymphotic lymphoma, L frozen shoulder, HLD, HTN, PVD, plantar fasciitis, sleep apnea with CPAP, heart catheterization, prostate surgery.   Clinical Impression   Pt presents with R knee pain, decreased R knee ROM, and decreased activity tolerance due to R knee pain. Pt to benefit from acute PT to address deficits. Pt ambulated 60 ft with RW with min guard assist, pt required multiple verbal cues throughout session for safety. Pt educated on quad sets (5-10/hour), ankle pumps (20/hour), and heel slides (5-10/hour) to perform this afternoon/evening to lessen stiffness and increase circulation, to pt's tolerance and limited by pain. PT to progress mobility as tolerated, and will continue to follow acutely.      Follow Up Recommendations Follow surgeon's recommendation for DC plan and follow-up therapies;Supervision for mobility/OOB(HHPT, then OPPT)    Equipment Recommendations  None recommended by PT    Recommendations for Other Services       Precautions / Restrictions Precautions Precautions: Fall Restrictions Weight Bearing Restrictions: No Other Position/Activity Restrictions: WBAT       Mobility  Bed Mobility Overal bed mobility: Needs Assistance Bed Mobility: Supine to Sit     Supine to sit: Min guard;HOB elevated     General bed mobility comments: Min guard for safety. Verbal cuing for sequencing, pt did not want physical assist from PT.   Transfers Overall transfer level: Needs assistance Equipment used: Rolling walker (2 wheeled) Transfers: Sit to/from Stand Sit to Stand: Min guard         General transfer comment: Min guard for safety, verbal cuing for hand placement.   Ambulation/Gait Ambulation/Gait assistance: Min guard Gait Distance (Feet): 60  Feet Assistive device: Rolling walker (2 wheeled) Gait Pattern/deviations: Step-to pattern;Step-through pattern;Decreased stride length Gait velocity: decr    General Gait Details: Min guard for safety. No RLE buckling present during ambulation. Verbal cuing for sequencing although pt quickly progressed to step-through gait, placement in RW, slowing down. Pt wanted to ambulate too quickly, advised to slow down for safety.   Stairs            Wheelchair Mobility    Modified Rankin (Stroke Patients Only)       Balance Overall balance assessment: Mild deficits observed, not formally tested                                           Pertinent Vitals/Pain Pain Assessment: 0-10 Pain Score: 4  Pain Location: R knee  Pain Descriptors / Indicators: Sore;Burning Pain Intervention(s): Limited activity within patient's tolerance;Repositioned;Ice applied;Monitored during session    [place] expects to be discharged to:: Private residence Living Arrangements: Spouse/significant other Available Help at Discharge: Family Type of Home: House Home Access: Stairs to enter Entrance Stairs-Rails: None(right handrail broken) Entrance Stairs-Number of Steps: 3 Home Layout: Two level;Able to live on main level with bedroom/bathroom Home Equipment: Gilford Rile - 2 wheels;Cane - single point;Tub bench;Bedside commode      Prior Function Level of Independence: Independent               Hand Dominance   Dominant Hand: Right    Extremity/Trunk Assessment   Upper Extremity Assessment Upper Extremity Assessment: Overall WFL for tasks assessed  Lower Extremity Assessment Lower Extremity Assessment: Overall WFL for tasks assessed;RLE deficits/detail RLE Deficits / Details: suspected post-surgical weakness; able to perform ankle pumps, quad sets, SLR without assist  RLE Sensation: WNL    Cervical / Trunk Assessment Cervical / Trunk Assessment:  Normal  Communication   Communication: No difficulties  Cognition Arousal/Alertness: Awake/alert Behavior During Therapy: Impulsive;WFL for tasks assessed/performed Overall Cognitive Status: Within Functional Limits for tasks assessed                                 General Comments: Pt moves quickly and wants to do things without assist      General Comments      Exercises Total Joint Exercises Ankle Circles/Pumps: AROM;Both;10 reps;Seated Goniometric ROM: Knee AAROM 10-90*, limited by pain and "burning"    Assessment/Plan    PT Assessment Patient needs continued PT services  PT Problem List Decreased strength;Pain;Decreased range of motion;Decreased activity tolerance;Decreased balance;Decreased knowledge of use of DME;Decreased mobility;Decreased safety awareness       PT Treatment Interventions Therapeutic activities;DME instruction;Gait training;Therapeutic exercise;Patient/family education;Stair training;Balance training;Functional mobility training    PT Goals (Current goals can be found in the Care Plan section)  Acute Rehab PT Goals Patient Stated Goal: go home  PT Goal Formulation: With patient Time For Goal Achievement: 01/08/18 Potential to Achieve Goals: Good    Frequency 7X/week   Barriers to discharge        Co-evaluation               AM-PAC PT "6 Clicks" Mobility  Outcome Measure Help needed turning from your back to your side while in a flat bed without using bedrails?: None Help needed moving from lying on your back to sitting on the side of a flat bed without using bedrails?: A Little Help needed moving to and from a bed to a chair (including a wheelchair)?: A Little Help needed standing up from a chair using your arms (e.g., wheelchair or bedside chair)?: A Little Help needed to walk in hospital room?: A Little Help needed climbing 3-5 steps with a railing? : A Little 6 Click Score: 19    End of Session Equipment Utilized  During Treatment: Gait belt Activity Tolerance: Patient tolerated treatment well Patient left: in chair;with chair alarm set;with call bell/phone within reach;with family/visitor present;with SCD's reapplied Nurse Communication: Mobility status PT Visit Diagnosis: Other abnormalities of gait and mobility (R26.89);Difficulty in walking, not elsewhere classified (R26.2)    Time: 2751-7001 PT Time Calculation (min) (ACUTE ONLY): 26 min   Charges:   PT Evaluation $PT Eval Low Complexity: 1 Low PT Treatments $Gait Training: 8-22 mins        Julien Girt, PT Acute Rehabilitation Services Pager [phone]  Office [phone]  Brason Berthelot D Elonda Husky 01/01/2018, 5:56 PM

## 2018-01-01 NOTE — Discharge Instructions (Signed)
You may bear weight as tolerated. Keep your dressing on and dry until follow up. Take medicine to prevent blood clots as directed. Take pain medicine as needed with the goal of transitioning to over the counter medicines.  If needed, you may increase pain medication for the first few days post op - up to 2 tablets every 4 hours.  Stop as needed pain medication as soon as you are able.  INSTRUCTIONS AFTER JOINT REPLACEMENT   o Remove items at home which could result in a fall. This includes throw rugs or furniture in walking pathways o ICE to the affected joint every three hours while awake for 30 minutes at a time, for at least the first 3-5 days, and then as needed for pain and swelling.  Continue to use ice for pain and swelling. You may notice swelling that will progress down to the foot and ankle.  This is normal after surgery.  Elevate your leg when you are not up walking on it.   o Continue to use the breathing machine you got in the hospital (incentive spirometer) which will help keep your temperature down.  It is common for your temperature to cycle up and down following surgery, especially at night when you are not up moving around and exerting yourself.  The breathing machine keeps your lungs expanded and your temperature down.   DIET:  As you were doing prior to hospitalization, we recommend a well-balanced diet.  DRESSING / WOUND CARE / SHOWERING  You may shower 3 days after surgery, but keep the wounds dry during showering.  You may use an occlusive plastic wrap (Press'n Seal for example) with blue painter's tape at edges, NO SOAKING/SUBMERGING IN THE BATHTUB.  If the bandage gets wet, change with a clean dry gauze.  If the incision gets wet, pat the wound dry with a clean towel.  ACTIVITY  o Increase activity slowly as tolerated, but follow the weight bearing instructions below.   o No driving for 6 weeks or until further direction given by your physician.  You cannot drive while  taking narcotics.  o No lifting or carrying greater than 10 lbs. until further directed by your surgeon. o Avoid periods of inactivity such as sitting longer than an hour when not asleep. This helps prevent blood clots.  o You may return to work once you are authorized by your doctor.     WEIGHT BEARING   Weight bearing as tolerated with assist device (walker, cane, etc) as directed, use it as long as suggested by your surgeon or therapist, typically at least 4-6 weeks.   EXERCISES  Results after joint replacement surgery are often greatly improved when you follow the exercise, range of motion and muscle strengthening exercises prescribed by your doctor. Safety measures are also important to protect the joint from further injury. Any time any of these exercises cause you to have increased pain or swelling, decrease what you are doing until you are comfortable again and then slowly increase them. If you have problems or questions, call your caregiver or physical therapist for advice.   Rehabilitation is important following a joint replacement. After just a few days of immobilization, the muscles of the leg can become weakened and shrink (atrophy).  These exercises are designed to build up the tone and strength of the thigh and leg muscles and to improve motion. Often times heat used for twenty to thirty minutes before working out will loosen up your tissues and help with  improving the range of motion but do not use heat for the first two weeks following surgery (sometimes heat can increase post-operative swelling).   These exercises can be done on a training (exercise) mat, on the floor, on a table or on a bed. Use whatever works the best and is most comfortable for you.    Use music or television while you are exercising so that the exercises are a pleasant break in your day. This will make your life better with the exercises acting as a break in your routine that you can look forward to.   Perform  all exercises about fifteen times, three times per day or as directed.  You should exercise both the operative leg and the other leg as well.  Exercises include:    Quad Sets - Tighten up the muscle on the front of the thigh (Quad) and hold for 5-10 seconds.    Straight Leg Raises - With your knee straight (if you were given a brace, keep it on), lift the leg to 60 degrees, hold for 3 seconds, and slowly lower the leg.  Perform this exercise against resistance later as your leg gets stronger.   Leg Slides: Lying on your back, slowly slide your foot toward your buttocks, bending your knee up off the floor (only go as far as is comfortable). Then slowly slide your foot back down until your leg is flat on the floor again.   Angel Wings: Lying on your back spread your legs to the side as far apart as you can without causing discomfort.   Hamstring Strength:  Lying on your back, push your heel against the floor with your leg straight by tightening up the muscles of your buttocks.  Repeat, but this time bend your knee to a comfortable angle, and push your heel against the floor.  You may put a pillow under the heel to make it more comfortable if necessary.   A rehabilitation program following joint replacement surgery can speed recovery and prevent re-injury in the future due to weakened muscles. Contact your doctor or a physical therapist for more information on knee rehabilitation.    CONSTIPATION  Constipation is defined medically as fewer than three stools per week and severe constipation as less than one stool per week.  Even if you have a regular bowel pattern at home, your normal regimen is likely to be disrupted due to multiple reasons following surgery.  Combination of anesthesia, postoperative narcotics, change in appetite and fluid intake all can affect your bowels.   YOU MUST use at least one of the following options; they are listed in order of increasing strength to get the job done.   They are all available over the counter, and you may need to use some, POSSIBLY even all of these options:    Drink plenty of fluids (prune juice may be helpful) and high fiber foods Colace 100 mg by mouth twice a day  Senokot for constipation as directed and as needed Dulcolax (bisacodyl), take with full glass of water  Miralax (polyethylene glycol) once or twice a day as needed.  If you have tried all these things and are unable to have a bowel movement in the first 3-4 days after surgery call either your surgeon or your primary doctor.    If you experience loose stools or diarrhea, hold the medications until you stool forms back up.  If your symptoms do not get better within 1 week or if they get worse,  check with your doctor.  If you experience "the worst abdominal pain ever" or develop nausea or vomiting, please contact the office immediately for further recommendations for treatment.   ITCHING:  If you experience itching with your medications, try taking only a single pain pill, or even half a pain pill at a time.  You can also use Benadryl over the counter for itching or also to help with sleep.   TED HOSE STOCKINGS:  Use stockings on both legs until for at least 2 weeks or as directed by physician office. They may be removed at night for sleeping.  MEDICATIONS:  See your medication summary on the After Visit Summary that nursing will review with you.  You may have some home medications which will be placed on hold until you complete the course of blood thinner medication.  It is important for you to complete the blood thinner medication as prescribed.  PRECAUTIONS:  If you experience chest pain or shortness of breath - call 911 immediately for transfer to the hospital emergency department.   If you develop a fever greater that 101 F, purulent drainage from wound, increased redness or drainage from wound, foul odor from the wound/dressing, or calf pain - CONTACT YOUR SURGEON.                                                    FOLLOW-UP APPOINTMENTS:  If you do not already have a post-op appointment, please call the office for an appointment to be seen by your surgeon.  Guidelines for how soon to be seen are listed in your After Visit Summary, but are typically between 1-4 weeks after surgery.  OTHER INSTRUCTIONS:     MAKE SURE YOU:   Understand these instructions.   Get help right away if you are not doing well or get worse.    Thank you for letting us be a part of your medical care team.  It is a privilege we respect greatly.  We hope these instructions will help you stay on track for a fast and full recovery!    Information on my medicine - XARELTO (Rivaroxaban)  This medication education was reviewed with me or my healthcare representative as part of my discharge preparation.  The pharmacist that spoke with me during my hospital stay was:    Why was Xarelto prescribed for you? Xarelto was prescribed for you to reduce the risk of blood clots forming after orthopedic surgery. The medical term for these abnormal blood clots is venous thromboembolism (VTE).  What do you need to know about xarelto ? Take your Xarelto ONCE DAILY at the same time every day. You may take it either with or without food.  If you have difficulty swallowing the tablet whole, you may crush it and mix in applesauce just prior to taking your dose.  Take Xarelto exactly as prescribed by your doctor and DO NOT stop taking Xarelto without talking to the doctor who prescribed the medication.  Stopping without other VTE prevention medication to take the place of Xarelto may increase your risk of developing a clot.  After discharge, you should have regular check-up appointments with your healthcare provider that is prescribing your Xarelto.    What do you do if you miss a dose? If you miss a dose, take it as soon as you remember  on the same day then continue your regularly scheduled once  daily regimen the next day. Do not take two doses of Xarelto on the same day.   Important Safety Information A possible side effect of Xarelto is bleeding. You should call your healthcare provider right away if you experience any of the following: ? Bleeding from an injury or your nose that does not stop. ? Unusual colored urine (red or dark brown) or unusual colored stools (red or black). ? Unusual bruising for unknown reasons. ? A serious fall or if you hit your head (even if there is no bleeding).  Some medicines may interact with Xarelto and might increase your risk of bleeding while on Xarelto. To help avoid this, consult your healthcare provider or pharmacist prior to using any new prescription or non-prescription medications, including herbals, vitamins, non-steroidal anti-inflammatory drugs (NSAIDs) and supplements.  This website has more information on Xarelto: [email or web address]

## 2018-01-02 ENCOUNTER — Encounter (HOSPITAL_COMMUNITY): Payer: Self-pay | Admitting: Orthopedic Surgery

## 2018-01-02 NOTE — Discharge Summary (Signed)
Discharge Summary  Patient ID: Jared Cowan MRN: [id number] DOB/AGE: [birth year] 62 y.o.  Admit date: 01/01/2018 Discharge date: 01/02/2018  Admission Diagnoses:  Primary osteoarthritis of right knee  Discharge Diagnoses:  Principal Problem:   Primary osteoarthritis of right knee Active Problems:   Small cell B-cell lymphoma (HCC)   Essential hypertension   Hyperlipidemia   Primary localized osteoarthritis of knee   Past Medical History:  Diagnosis Date  . Cancer ([place]) 03/2014   Small Lymphotic Lymphoma  . Fracture    left arm  . 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 Hemorrhoids   . HOH (hard of hearing)   . Hyperlipidemia   . Hypertension   . Low testosterone   . Osteoarthritis    int he neck with pinched nerve   . Peripheral vascular disease ([place])   . Plantar fasciitis of left foot   . Precancerous skin lesion    Eye  . Sleep apnea    CPAP  . Tinnitus, bilateral   . Vitreous degeneration, left eye   . Wears dentures    full upper, partial lower    Surgeries: Procedure(s): RIGHT TOTAL KNEE ARTHROPLASTY on 01/01/2018   Consultants (if any):   Discharged Condition: Improved  Hospital Course: Jared Cowan is an 62 y.o. male who was admitted 01/01/2018 with a diagnosis of Primary osteoarthritis of right knee and went to the operating room on 01/01/2018 and underwent the above named procedures.    He was given perioperative antibiotics:  Anti-infectives (From admission, onward)   Start     Dose/Rate Route Frequency Ordered Stop   01/01/18 1600  ceFAZolin (ANCEF) IVPB 2g/100 mL premix     2 g 200 mL/hr over 30 Minutes Intravenous Every 6 hours 01/01/18 1341 01/01/18 2136   01/01/18 0800  ceFAZolin (ANCEF) IVPB 2g/100 mL premix     2 g 200 mL/hr over 30 Minutes Intravenous On call to O.R. 01/01/18 0757 01/01/18 1015    .  He was given sequential compression devices, early ambulation, and Xarelto for DVT prophylaxis.  He benefited maximally  from the hospital stay and there were no complications.    Recent vital signs:  Vitals:   01/02/18 0150 01/02/18 0512  BP: 118/71 113/68  Pulse: 60 60  Resp: 16 18  Temp:  98.2 F (36.8 C)  SpO2: 96% 96%    Recent laboratory studies:  Lab Results  Component Value Date   HGB 18.1 (H) 12/11/2017   HGB 17.9 (H) 11/13/2017   HGB 17.6 06/15/2017   Lab Results  Component Value Date   WBC 6.3 12/11/2017   PLT 174 12/11/2017   Lab Results  Component Value Date   INR 1.0 12/11/2017   Lab Results  Component Value Date   NA 139 12/11/2017   K 3.9 12/11/2017   CL 99 12/11/2017   CO2 26 12/11/2017   BUN 23 12/11/2017   CREATININE 1.21 12/11/2017   GLUCOSE 72 12/11/2017    Discharge Medications:   Allergies as of 01/02/2018      Reactions   Adhesive [tape] Rash   Bandaids after long term use Kinetic tape causes rash  Tape for IV is okay       Medication List    TAKE these medications   acetaminophen 500 MG tablet Commonly known as:  TYLENOL Take 2 tablets (1,000 mg total) by mouth every 8 (eight) hours for 14 days. For Pain.   ANDROGEL PUMP 20.25  MG/ACT (1.62%) Gel Generic drug:  Testosterone Apply 2 Pump topically daily.   benazepril 20 MG tablet Commonly known as:  LOTENSIN TAKE ONE & ONE-HALF TABLETS BY MOUTH ONCE DAILY What changed:    how much to take  how to take this  when to take this   docusate sodium 100 MG capsule Commonly known as:  COLACE Take 1 capsule (100 mg total) by mouth 2 (two) times daily. To prevent constipation while taking pain medication.   EQL OMEGA 3 FISH OIL 1200 MG Cpdr Take 1,200 mg by mouth daily. Also includes 360mg  of Omega 3   gabapentin 300 MG capsule Commonly known as:  NEURONTIN Take 1 capsule (300 mg total) by mouth 2 (two) times daily for 14 days. For 2 weeks post op for pain.   hydrochlorothiazide 25 MG tablet Commonly known as:  HYDRODIURIL Take 1 tablet (25 mg total) by mouth daily.   Ibuprofen-Famotidine  800-26.6 MG Tabs Take 1 tablet by mouth 3 (three) times daily as needed (arthritis).   methocarbamol 750 MG tablet Commonly known as:  ROBAXIN Take 1 tablet (750 mg total) by mouth every 6 (six) hours as needed for muscle spasms.   multivitamin with minerals Tabs tablet Take 1 tablet by mouth daily.   NON FORMULARY 1 application by Does not apply route at bedtime. CPAP   ondansetron 4 MG tablet Commonly known as:  ZOFRAN Take 1 tablet (4 mg total) by mouth every 8 (eight) hours as needed for nausea or vomiting.   oxyCODONE 5 MG immediate release tablet Commonly known as:  Oxy IR/ROXICODONE Take 1 tablet (5 mg total) by mouth every 4 (four) hours as needed for breakthrough pain.   rivaroxaban 10 MG Tabs tablet Commonly known as:  XARELTO Take 1 tablet (10 mg total) by mouth daily. For 30 days for DVT prophylaxis   rosuvastatin 20 MG tablet Commonly known as:  CRESTOR Take 1 tablet (20 mg total) by mouth daily. What changed:  how much to take   vardenafil 20 MG tablet Commonly known as:  LEVITRA Take 20 mg by mouth daily as needed for erectile dysfunction.       Diagnostic Studies: Dg Knee Right Port  Result Date: 01/01/2018 CLINICAL DATA:  Right knee replacement EXAM: PORTABLE RIGHT KNEE - 2 VIEW COMPARISON:  None. FINDINGS: Right knee prosthesis is noted in satisfactory position. No acute bony or soft tissue abnormality is seen. IMPRESSION: Status post right knee replacement. Electronically Signed   By: Inez Catalina M.D.   On: 01/01/2018 12:58    Disposition:     Follow-up Information    Renette Butters, MD Follow up.   Specialty:  Orthopedic Surgery Contact information: [address] [place] [address] [phone]            Signed: Prudencio Burly III PA-C 01/02/2018, 8:10 AM

## 2018-01-02 NOTE — Care Management Note (Signed)
Case Management Note  Patient Details  Name: STANISLAV GERVASE MRN: [id number] Date of Birth: 1955-08-09  Subjective/Objective:    Discharge planning, spoke with patient at bedside. Have chosen [facility] PT, evaluate and treat.  Action/Plan: [facility] for referral. Has DME. [phone]                Expected Discharge Date:  01/02/18               Expected Discharge Plan:  [place]  In-House Referral:  NA  Discharge planning Services  CM Consult  Post Acute Care Choice:  Home Health Choice offered to:  Patient  DME Arranged:  N/A DME Agency:  NA  HH Arranged:  PT [place] Agency:  [facility] (formerly [employer])  Status of Service:  Completed, signed off  If discussed at [employer], dates discussed:    Additional Comments:  Guadalupe Maple, RN 01/02/2018, 9:49 AM

## 2018-01-02 NOTE — Progress Notes (Addendum)
    Subjective: Patient reports pain as mild.  Tolerating diet.  Urinating. Marland Kitchen  No CP, SOB.  Walked in Optometrist.  Objective:   VITALS:   Vitals:   01/01/18 2149 01/01/18 2218 01/02/18 0150 01/02/18 0512  BP:  134/86 118/71 113/68  Pulse: 62 62 60 60  Resp: 16 16 16 18   Temp:  98.2 F (36.8 C)  98.2 F (36.8 C)  TempSrc:      SpO2: 97% 91% 96% 96%  Weight:      Height:       CBC Latest Ref Rng & Units 12/11/2017 11/13/2017 06/15/2017  WBC 3.4 - 10.8 x10E3/uL 6.3 5.8 6.4  Hemoglobin 13.0 - 17.7 g/dL 18.1(H) 17.9(H) 17.6  Hematocrit 37.5 - 51.0 % 53.0(H) 51.5 50.7  Platelets 150 - 450 x10E3/uL 174 163 167   BMP Latest Ref Rng & Units 12/11/2017 11/13/2017 06/15/2017  Glucose 65 - 99 mg/dL 72 - 78  BUN 8 - 27 mg/dL 23 - 19  Creatinine 0.76 - 1.27 mg/dL 1.21 1.16 1.11  BUN/Creat Ratio 10 - 24 19 - 17  Sodium 134 - 144 mmol/L 139 - 139  Potassium 3.5 - 5.2 mmol/L 3.9 - 4.1  Chloride 96 - 106 mmol/L 99 - 99  CO2 20 - 29 mmol/L 26 - 23  Calcium 8.6 - 10.2 mg/dL 10.0 - 10.2   Intake/Output      12/03 0701 - 12/04 0700 12/04 0701 - 12/05 0700   P.O. 1620    I.V. (mL/kg) 3781.2 (36.1)    IV Piggyback 199.9    Total Intake(mL/kg) 5601.1 (53.4)    Urine (mL/kg/hr) 4450    Stool 0    Blood 50    Total Output 4500    Net +1101.1         Stool Occurrence 0 x       Physical Exam: General: NAD.  Upright in bed on arrival.  Calm, conversant. Resp: No increased wob Cardio: regular rate and rhythm ABD soft Neurologically intact MSK Neurovascularly intact Sensation intact distally Feet warm Dorsiflexion/Plantar flexion intact Incision: dressing C/D/I   Assessment: 1 Day Post-Op  S/P Procedure(s) (LRB): RIGHT TOTAL KNEE ARTHROPLASTY (Right) by Dr. Ernesta Amble. Percell Miller on 01/01/2018  Principal Problem:   Primary osteoarthritis of right knee Active Problems:   Small cell B-cell lymphoma (HCC)   Essential hypertension   Hyperlipidemia   Primary localized osteoarthritis  of knee   Primary osteoarthritis, status post right total knee arthroplasty Doing well postop day 1 Eating, drinking, and voiding Pain controlled Mobilizing well-has walked in the hallway several times.  Plan: Up with therapy Incentive Spirometry Elevate and Apply ice CPM, bone foam  Weight Bearing: Weight Bearing as Tolerated (WBAT)  Dressings: Maintain Mepilex.   VTE prophylaxis: Xarelto, SCDs, ambulation Dispo: Home today  Insurance has approved inpatient stay.  Patient's anticipated LOS is less than 2 midnights, meeting these requirements: - Younger than 44 - Lives within 1 hour of care - Has a competent adult at home to recover with post-op recover - NO history of  - Chronic pain requiring opiods  - Diabetes  - Coronary Artery Disease  - Heart failure  - Heart attack  - Stroke  - DVT/VTE  - Cardiac arrhythmia  - Respiratory Failure/COPD  - Renal failure  - Anemia  - Advanced Liver disease   Prudencio Burly III, PA-C 01/02/2018, 8:10 AM

## 2018-01-02 NOTE — Evaluation (Signed)
Occupational Therapy Evaluation Patient Details Name: Jared Cowan MRN: [id number] DOB: 07/20/1955 Today's Date: 01/02/2018    History of Present Illness 62 yo male s/p R TKR on 01/01/18. PMH includes small lymphotic lymphoma, L frozen shoulder, HLD, HTN, PVD, plantar fasciitis, sleep apnea with CPAP, heart catheterization, prostate surgery.    Clinical Impression   Pt was admitted for the above. He is very independent natured and needs min guard for safety during adls and bathroom transfers. Reviewed safety recommendations and pt verbalizes understanding     Follow Up Recommendations  Supervision/Assistance - 24 hour(for safety)    Equipment Recommendations  None recommended by OT    Recommendations for Other Services       Precautions / Restrictions Precautions Precautions: Fall;Knee Restrictions Other Position/Activity Restrictions: WBAT       Mobility Bed Mobility Overal bed mobility: Modified Independent             General bed mobility comments: HOB raised  Transfers   Equipment used: Rolling walker (2 wheeled)   Sit to Stand: Supervision              Balance                                           ADL either performed or assessed with clinical judgement   ADL Overall ADL's : Needs assistance/impaired                         Toilet Transfer: Min guard;BSC;RW;Ambulation             General ADL Comments: pt completed adl in bathroom with min guard for safety as he moves quickly and turned to urinate without RW.  Also stood to pull pants up with one ted hose stocking on. Cued for safety, educated to keep RW in front of him at all times and also to sidestep if needed through tight spaces.  Reinforced knee precautions, pt with good follow through     Vision         Perception     Praxis      Pertinent Vitals/Pain Pain Score: 3  Pain Location: R knee  Pain Descriptors / Indicators: Sore Pain  Intervention(s): Limited activity within patient's tolerance;Monitored during session;Premedicated before session;Repositioned     Hand Dominance Right   Extremity/Trunk Assessment Upper Extremity Assessment Upper Extremity Assessment: Overall WFL for tasks assessed           Communication Communication Communication: No difficulties   Cognition Arousal/Alertness: Awake/alert Behavior During Therapy: Impulsive Overall Cognitive Status: Within Functional Limits for tasks assessed                                 General Comments: cues for safety   General Comments       Exercises     Shoulder Instructions      Home Living Family/patient expects to be discharged to:: Private residence Living Arrangements: Spouse/significant other                 Bathroom Shower/Tub: Teaching laboratory technician Toilet: Handicapped height     Home Equipment: Environmental consultant - 2 wheels;Cane - single point;Tub bench;Bedside commode   Additional Comments: multiple family members have used tub bench; verbalizes understanding of use  Prior Functioning/Environment Level of Independence: Independent        Comments: just retired last week        OT Problem List:        OT Treatment/Interventions:      OT Goals(Current goals can be found in the care plan section) Acute Rehab OT Goals Patient Stated Goal: go home  OT Goal Formulation: All assessment and education complete, DC therapy  OT Frequency:     Barriers to D/C:            Co-evaluation              AM-PAC OT "6 Clicks" Daily Activity     Outcome Measure Help from another person eating meals?: None Help from another person taking care of personal grooming?: A Little Help from another person toileting, which includes using toliet, bedpan, or urinal?: A Little Help from another person bathing (including washing, rinsing, drying)?: A Little Help from another person to put on and taking off regular  upper body clothing?: A Little Help from another person to put on and taking off regular lower body clothing?: A Little 6 Click Score: 19   End of Session    Activity Tolerance: Patient tolerated treatment well Patient left: in chair;with call bell/phone within reach;with chair alarm set  OT Visit Diagnosis: Pain Pain - Right/Left: Right Pain - part of body: Knee                Time: 5277-8242 OT Time Calculation (min): 23 min Charges:  OT General Charges $OT Visit: 1 Visit OT Evaluation $OT Eval Low Complexity: [place], OTR/L Acute Rehabilitation Services [phone] [facility] pager [phone] office 01/02/2018  [place] 01/02/2018, 9:54 AM

## 2018-01-02 NOTE — Plan of Care (Signed)
Nutrition Education Note  RD consulted for nutrition education. Patient requested to see RD for diet education to help with healing from his knee surgery and then wanted information on losing weight.   Patient would benefit from outpatient nutrition education. Pt and pt's wife state they will look into scheduling a visit with an outpatient RD at [facility].   RD provided "Heart Healthy Nutrition Therapy" "Smart Snacking for Adults", and "Sodium free flavoring" handouts from the Academy of [employer] Dietetics. Reviewed patient's dietary recall. Provided examples on ways to decrease sodium and fat intake in diet. Discouraged intake of processed foods and use of salt shaker. Encouraged fresh fruits and vegetables as well as whole grain sources of carbohydrates to maximize fiber intake. Provided examples of healthy fats to include in diet for inflammation.Teach back method used.  Expect good compliance. Pt and pt's wife are planning to make dietary and lifestyle changes together.  Body mass index is 34.11 kg/m. Pt meets criteria for obesity based on current BMI.  Current diet order is regular, patient is consuming approximately 100% of meals at this time. Labs and medications reviewed. No further nutrition interventions warranted at this time. If additional nutrition issues arise, please re-consult RD.  Clayton Bibles, MS, RD, [place] Dietitian Pager: [phone] After Hours Pager: [phone]

## 2018-01-02 NOTE — Plan of Care (Signed)
Patient to discharge home. IV dc'd. Rx given. Discharge instructions given to patient and wife, both verbalized understanding.

## 2018-01-02 NOTE — Progress Notes (Signed)
Physical Therapy Treatment Patient Details Name: Jared Cowan MRN: [id number] DOB: [birth year] Today's Date: 01/02/2018    History of Present Illness 62 yo male s/p R TKR on 01/01/18. PMH includes small lymphotic lymphoma, L frozen shoulder, HLD, HTN, PVD, plantar fasciitis, sleep apnea with CPAP, heart catheterization, prostate surgery.     PT Comments    Pt ambulated in hallway and practiced steps.  Pt also performed LE exercises and provided with HEP handout. Pt encouraged to consider slowing down and thinking safety first upon d/c home especially due to decreased R ankle DF at this time (RN notified and contacting MD).  Pt feels comfortable with d/c home today and plans to follow up with HHPT and then OP PT.   Follow Up Recommendations  Follow surgeon's recommendation for DC plan and follow-up therapies;Supervision for mobility/OOB     Equipment Recommendations  None recommended by PT    Recommendations for Other Services       Precautions / Restrictions Precautions Precautions: Fall;Knee Restrictions Other Position/Activity Restrictions: WBAT     Mobility  Bed Mobility Overal bed mobility: Modified Independent             General bed mobility comments: HOB raised  Transfers Overall transfer level: Needs assistance Equipment used: Rolling walker (2 wheeled) Transfers: Sit to/from Stand Sit to Stand: Supervision         General transfer comment: supervision for safety, cues for UE and LE positioning for pain control  Ambulation/Gait Ambulation/Gait assistance: Min guard;Supervision Gait Distance (Feet): 240 Feet Assistive device: Rolling walker (2 wheeled) Gait Pattern/deviations: Decreased dorsiflexion - right;Step-through pattern;Decreased stride length     General Gait Details: min/guard and supervision for safety, observed decreased R heel strike and DF, upon further investigating pt presents with limited active DF which he states he was able to  perform ankle pump yesterday so RN notified), safety cues provided for pt to slow down and be cautious of R toe catching/dragging due to decreased DF at this time   Stairs Stairs: Yes Stairs assistance: Min guard Stair Management: Alternating pattern;Step to pattern;Forwards;Two rails Number of Stairs: 3 General stair comments: pt moving quickly and performing alternating pattern at first, provided cues for pt to slow down and perform step to pattern for safety, pt performed again with step to pattern, pt does not have rails however utilized 2 rails for steps and declined practicing with RW (reports he will have assist into home)   Wheelchair Mobility    Modified Rankin (Stroke Patients Only)       Balance                                            Cognition Arousal/Alertness: Awake/alert Behavior During Therapy: Impulsive Overall Cognitive Status: Within Functional Limits for tasks assessed                                 General Comments: cues for safety      Exercises Total Joint Exercises Ankle Circles/Pumps: 10 reps;Left;AROM Quad Sets: AROM;10 reps;Both Short Arc Quad: AROM;10 reps;Right Heel Slides: AAROM;10 reps;Right Hip ABduction/ADduction: AROM;10 reps;Right Straight Leg Raises: AROM;10 reps;Right Goniometric ROM: approx 100* AAROM R knee flexion supine    General Comments        Pertinent Vitals/Pain Pain Assessment: 0-10 Pain Score: 4  Pain Location: R knee  Pain Descriptors / Indicators: Sore Pain Intervention(s): Limited activity within patient's tolerance;Repositioned;Monitored during session;RN gave pain meds during session    Home Living Family/patient expects to be discharged to:: Private residence Living Arrangements: Spouse/significant other           Home Equipment: Environmental consultant - 2 wheels;Cane - single point;Tub bench;Bedside commode Additional Comments: multiple family members have used tub bench;  verbalizes understanding of use    Prior Function Level of Independence: Independent      Comments: just retired last week   PT Goals (current goals can now be found in the care plan section) Acute Rehab PT Goals Patient Stated Goal: go home  Progress towards PT goals: Progressing toward goals    Frequency    7X/week      PT Plan Current plan remains appropriate    Co-evaluation              AM-PAC PT "6 Clicks" Mobility   Outcome Measure  Help needed turning from your back to your side while in a flat bed without using bedrails?: None Help needed moving from lying on your back to sitting on the side of a flat bed without using bedrails?: None Help needed moving to and from a bed to a chair (including a wheelchair)?: A Little Help needed standing up from a chair using your arms (e.g., wheelchair or bedside chair)?: A Little Help needed to walk in hospital room?: A Little Help needed climbing 3-5 steps with a railing? : A Little 6 Click Score: 20    End of Session Equipment Utilized During Treatment: Gait belt Activity Tolerance: Patient tolerated treatment well Patient left: in bed;with call bell/phone within reach Nurse Communication: Mobility status PT Visit Diagnosis: Other abnormalities of gait and mobility (R26.89)     Time: 6213-0865 PT Time Calculation (min) (ACUTE ONLY): 30 min  Charges:  $Gait Training: 8-22 mins $Therapeutic Exercise: 8-22 mins                     Carmelia Bake, PT, DPT Acute Rehabilitation Services Office: [phone] Pager: [place] 01/02/2018, 12:42 PM

## 2018-01-07 ENCOUNTER — Encounter: Payer: Self-pay | Admitting: Family Medicine

## 2018-01-10 ENCOUNTER — Other Ambulatory Visit: Payer: 59

## 2018-01-10 ENCOUNTER — Inpatient Hospital Stay: Admission: RE | Admit: 2018-01-10 | Payer: 59 | Source: Ambulatory Visit

## 2018-01-17 ENCOUNTER — Inpatient Hospital Stay
Admission: EM | Admit: 2018-01-17 | Discharge: 2018-01-30 | DRG: 872 | Disposition: E | Payer: 59 | Attending: Specialist | Admitting: Specialist

## 2018-01-17 ENCOUNTER — Other Ambulatory Visit: Payer: Self-pay

## 2018-01-17 ENCOUNTER — Emergency Department: Payer: 59

## 2018-01-17 DIAGNOSIS — I469 Cardiac arrest, cause unspecified: Secondary | ICD-10-CM | POA: Diagnosis not present

## 2018-01-17 DIAGNOSIS — Z87891 Personal history of nicotine dependence: Secondary | ICD-10-CM

## 2018-01-17 DIAGNOSIS — B961 Klebsiella pneumoniae [K. pneumoniae] as the cause of diseases classified elsewhere: Secondary | ICD-10-CM | POA: Diagnosis present

## 2018-01-17 DIAGNOSIS — C8590 Non-Hodgkin lymphoma, unspecified, unspecified site: Secondary | ICD-10-CM | POA: Diagnosis present

## 2018-01-17 DIAGNOSIS — N390 Urinary tract infection, site not specified: Secondary | ICD-10-CM

## 2018-01-17 DIAGNOSIS — A4159 Other Gram-negative sepsis: Principal | ICD-10-CM | POA: Diagnosis present

## 2018-01-17 DIAGNOSIS — N3001 Acute cystitis with hematuria: Secondary | ICD-10-CM | POA: Diagnosis present

## 2018-01-17 DIAGNOSIS — I739 Peripheral vascular disease, unspecified: Secondary | ICD-10-CM | POA: Diagnosis present

## 2018-01-17 DIAGNOSIS — R319 Hematuria, unspecified: Secondary | ICD-10-CM

## 2018-01-17 DIAGNOSIS — Z91048 Other nonmedicinal substance allergy status: Secondary | ICD-10-CM | POA: Diagnosis not present

## 2018-01-17 DIAGNOSIS — G4730 Sleep apnea, unspecified: Secondary | ICD-10-CM | POA: Diagnosis present

## 2018-01-17 DIAGNOSIS — Z79891 Long term (current) use of opiate analgesic: Secondary | ICD-10-CM

## 2018-01-17 DIAGNOSIS — I10 Essential (primary) hypertension: Secondary | ICD-10-CM | POA: Diagnosis present

## 2018-01-17 DIAGNOSIS — Z96651 Presence of right artificial knee joint: Secondary | ICD-10-CM | POA: Diagnosis present

## 2018-01-17 DIAGNOSIS — E785 Hyperlipidemia, unspecified: Secondary | ICD-10-CM | POA: Diagnosis present

## 2018-01-17 DIAGNOSIS — Z9889 Other specified postprocedural states: Secondary | ICD-10-CM

## 2018-01-17 DIAGNOSIS — Z79899 Other long term (current) drug therapy: Secondary | ICD-10-CM

## 2018-01-17 DIAGNOSIS — H9190 Unspecified hearing loss, unspecified ear: Secondary | ICD-10-CM | POA: Diagnosis present

## 2018-01-17 DIAGNOSIS — Z7901 Long term (current) use of anticoagulants: Secondary | ICD-10-CM

## 2018-01-17 DIAGNOSIS — R5081 Fever presenting with conditions classified elsewhere: Secondary | ICD-10-CM

## 2018-01-17 DIAGNOSIS — E871 Hypo-osmolality and hyponatremia: Secondary | ICD-10-CM | POA: Diagnosis present

## 2018-01-17 DIAGNOSIS — A419 Sepsis, unspecified organism: Secondary | ICD-10-CM | POA: Diagnosis present

## 2018-01-17 DIAGNOSIS — E860 Dehydration: Secondary | ICD-10-CM | POA: Diagnosis present

## 2018-01-17 DIAGNOSIS — Z8249 Family history of ischemic heart disease and other diseases of the circulatory system: Secondary | ICD-10-CM

## 2018-01-17 LAB — URINALYSIS, COMPLETE (UACMP) WITH MICROSCOPIC
Bilirubin Urine: NEGATIVE
Glucose, UA: NEGATIVE mg/dL
Ketones, ur: NEGATIVE mg/dL
Nitrite: NEGATIVE
PH: 5 (ref 5.0–8.0)
Protein, ur: 30 mg/dL — AB
Specific Gravity, Urine: 1.019 (ref 1.005–1.030)
WBC, UA: >50 WBC/hpf — ABNORMAL HIGH (ref 0–5)

## 2018-01-17 LAB — INFLUENZA PANEL BY PCR (TYPE A & B)
Influenza A By PCR: NEGATIVE
Influenza B By PCR: NEGATIVE

## 2018-01-17 LAB — PROTIME-INR
INR: 1.24
Prothrombin Time: 15.5 seconds — ABNORMAL HIGH (ref 11.4–15.2)

## 2018-01-17 LAB — COMPREHENSIVE METABOLIC PANEL
ALT: 42 U/L (ref 0–44)
AST: 27 U/L (ref 15–41)
Albumin: 4.4 g/dL (ref 3.5–5.0)
Alkaline Phosphatase: 78 U/L (ref 38–126)
Anion gap: 10 (ref 5–15)
BUN: 24 mg/dL — ABNORMAL HIGH (ref 8–23)
CO2: 24 mmol/L (ref 22–32)
CREATININE: 1.1 mg/dL (ref 0.61–1.24)
Calcium: 9.6 mg/dL (ref 8.9–10.3)
Chloride: 97 mmol/L — ABNORMAL LOW (ref 98–111)
GFR calc Af Amer: >60 mL/min (ref >60)
GFR calc non Af Amer: >60 mL/min (ref >60)
Glucose, Bld: 109 mg/dL — ABNORMAL HIGH (ref 70–99)
Potassium: 3.5 mmol/L (ref 3.5–5.1)
Sodium: 131 mmol/L — ABNORMAL LOW (ref 135–145)
Total Bilirubin: 1.2 mg/dL (ref 0.3–1.2)
Total Protein: 7.7 g/dL (ref 6.5–8.1)

## 2018-01-17 LAB — CBC WITH DIFFERENTIAL/PLATELET
Abs Immature Granulocytes: 0.09 10*3/uL — ABNORMAL HIGH (ref 0.00–0.07)
BASOS ABS: 0 10*3/uL (ref 0.0–0.1)
Basophils Relative: 0 %
EOS PCT: 0 %
Eosinophils Absolute: 0 10*3/uL (ref 0.0–0.5)
HCT: 46.7 % (ref 39.0–52.0)
Hemoglobin: 16.3 g/dL (ref 13.0–17.0)
Immature Granulocytes: 1 %
Lymphocytes Relative: 5 %
Lymphs Abs: 0.7 10*3/uL (ref 0.7–4.0)
MCH: 31.1 pg (ref 26.0–34.0)
MCHC: 34.9 g/dL (ref 30.0–36.0)
MCV: 89.1 fL (ref 80.0–100.0)
Monocytes Absolute: 0.9 10*3/uL (ref 0.1–1.0)
Monocytes Relative: 7 %
NRBC: 0 % (ref 0.0–0.2)
Neutro Abs: 11.1 10*3/uL — ABNORMAL HIGH (ref 1.7–7.7)
Neutrophils Relative %: 87 %
Platelets: 308 10*3/uL (ref 150–400)
RBC: 5.24 MIL/uL (ref 4.22–5.81)
RDW: 11.9 % (ref 11.5–15.5)
WBC: 12.8 10*3/uL — ABNORMAL HIGH (ref 4.0–10.5)

## 2018-01-17 LAB — CG4 I-STAT (LACTIC ACID): Lactic Acid, Venous: 1.66 mmol/L (ref 0.5–1.9)

## 2018-01-17 MED ORDER — DOCUSATE SODIUM 100 MG PO CAPS: 100.0000 mg | ORAL_CAPSULE | Freq: Two times a day (BID) | ORAL | Status: DC

## 2018-01-17 MED ORDER — SODIUM CHLORIDE 0.9 % IV BOLUS
1000.0000 mL | Freq: Once | INTRAVENOUS | Status: AC
  Administered 2018-01-17: 1000 mL via INTRAVENOUS

## 2018-01-17 MED ORDER — BENAZEPRIL HCL 20 MG PO TABS
30.0000 mg | ORAL_TABLET | Freq: Every day | ORAL | Status: DC
  Filled 2018-01-17: qty 1

## 2018-01-17 MED ORDER — ADULT MULTIVITAMIN W/MINERALS CH: 1.0000 | ORAL_TABLET | Freq: Every day | ORAL | Status: DC

## 2018-01-17 MED ORDER — ROSUVASTATIN CALCIUM 10 MG PO TABS: 10.0000 mg | ORAL_TABLET | ORAL | Status: DC

## 2018-01-17 MED ORDER — ACETAMINOPHEN 500 MG PO TABS
ORAL_TABLET | ORAL | Status: AC
  Administered 2018-01-17: 1000 mg via ORAL
  Filled 2018-01-17: qty 2

## 2018-01-17 MED ORDER — ONDANSETRON HCL 4 MG/2ML IJ SOLN: 4.0000 mg | Freq: Four times a day (QID) | INTRAMUSCULAR | Status: DC | PRN

## 2018-01-17 MED ORDER — RIVAROXABAN 10 MG PO TABS: 10.0000 mg | ORAL_TABLET | Freq: Every day | ORAL | Status: DC

## 2018-01-17 MED ORDER — ONDANSETRON HCL 4 MG PO TABS: 4.0000 mg | ORAL_TABLET | Freq: Four times a day (QID) | ORAL | Status: DC | PRN

## 2018-01-17 MED ORDER — OMEGA-3-ACID ETHYL ESTERS 1 G PO CAPS: 1000.0000 mg | ORAL_CAPSULE | Freq: Every day | ORAL | Status: DC

## 2018-01-17 MED ORDER — METHOCARBAMOL 500 MG PO TABS: 750.0000 mg | ORAL_TABLET | Freq: Four times a day (QID) | ORAL | Status: DC | PRN

## 2018-01-17 MED ORDER — GABAPENTIN 100 MG PO CAPS: 100.0000 mg | ORAL_CAPSULE | Freq: Three times a day (TID) | ORAL | Status: DC

## 2018-01-17 MED ORDER — ACETAMINOPHEN 650 MG RE SUPP: 650.0000 mg | Freq: Four times a day (QID) | RECTAL | Status: DC | PRN

## 2018-01-17 MED ORDER — SODIUM CHLORIDE 0.9 % IV SOLN
INTRAVENOUS | Status: DC
  Administered 2018-01-17: via INTRAVENOUS

## 2018-01-17 MED ORDER — PIPERACILLIN-TAZOBACTAM 3.375 G IVPB 30 MIN
3.3750 g | Freq: Once | INTRAVENOUS | Status: AC
  Administered 2018-01-17: 3.375 g via INTRAVENOUS
  Filled 2018-01-17: qty 50

## 2018-01-17 MED ORDER — ACETAMINOPHEN 500 MG PO TABS
1000.0000 mg | ORAL_TABLET | Freq: Once | ORAL | Status: AC
  Administered 2018-01-17: 1000 mg via ORAL

## 2018-01-17 MED ORDER — SODIUM CHLORIDE 0.9 % IV SOLN
1.0000 g | INTRAVENOUS | Status: DC
  Filled 2018-01-17 (×2): qty 10

## 2018-01-17 MED ORDER — TESTOSTERONE 20.25 MG/ACT (1.62%) TD GEL: 2.0000 | Freq: Every day | TRANSDERMAL | Status: DC

## 2018-01-17 MED ORDER — ACETAMINOPHEN 325 MG PO TABS
650.0000 mg | ORAL_TABLET | Freq: Four times a day (QID) | ORAL | Status: DC | PRN
  Filled 2018-01-17: qty 2

## 2018-01-17 NOTE — Progress Notes (Signed)
CODE SEPSIS - PHARMACY COMMUNICATION  **Broad Spectrum Antibiotics should be administered within 1 hour of Sepsis diagnosis**  Time Code Sepsis Called/Page Received:   12/19 @ 20:40   Antibiotics Ordered: Zosyn, Vanc   Time of 1st antibiotic administration: 12/19 @ 2054   Additional action taken by pharmacy:   If necessary, Name of Provider/Nurse Contacted:     Kandie Keiper D ,PharmD Clinical Pharmacist  01/27/2018  8:56 PM

## 2018-01-17 NOTE — H&P (Signed)
[facility] NAME: Jared Cowan    MR#:  [id number]  DATE OF BIRTH:  1955/06/22  DATE OF ADMISSION:  01/29/2018  PRIMARY CARE PHYSICIAN: Valerie Roys, DO   REQUESTING/REFERRING PHYSICIAN: Dr. Conni Slipper  CHIEF COMPLAINT:   Chief Complaint  Patient presents with  . Fever    HISTORY OF PRESENT ILLNESS:  Jared Cowan  is a 62 y.o. male with a known history of osteoarthritis status post recent right knee total knee replacement surgery done 2 weeks ago, sleep apnea, hypertension, lymphoma on observation presents to hospital secondary to fevers and chills that started today. Patient had been doing well after discharge from the hospital 2-1/2 weeks ago.  He worked with home physical therapy and is currently doing outpatient physical therapy.  Denies any sick contacts.  No recent travel.  He had a Foley catheter only for his surgery and has not had any urinary complaints.  Yesterday he told his wife that he has the urge to go and increased frequency but minimal urine output.  Denies any dysuria or hematuria.  This afternoon he was noted to have chills and had a fever of 104 F at home and so presents to the emergency room.  Urine is infected and has an elevated white count.  PAST MEDICAL HISTORY:   Past Medical History:  Diagnosis Date  . 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 Fracture    left arm  . 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 Hemorrhoids   . HOH (hard of hearing)   . Hyperlipidemia   . Hypertension   . Low testosterone   . Osteoarthritis    int he neck with pinched nerve   . Peripheral vascular disease ([place])   . Plantar fasciitis of left foot   . Precancerous skin lesion    Eye  . Sleep apnea    CPAP  . Tinnitus, bilateral   . Vitreous degeneration, left eye   . Wears dentures    full upper, partial lower    PAST SURGICAL HISTORY:   Past Surgical History:  Procedure  Laterality Date  . APPENDECTOMY    . COLONOSCOPY WITH PROPOFOL N/A 12/13/2016   Procedure: COLONOSCOPY WITH PROPOFOL;  Surgeon: Lin Landsman, MD;  Location: [facility];  Service: Endoscopy;  Laterality: N/A;  sleep apnea  . [place]  . LASER OF PROSTATE W/ GREEN LIGHT PVP  2014  . LEFT HEART CATH AND CORONARY ANGIOGRAPHY Left 03/21/2016   Procedure: Left Heart Cath and Coronary Angiography;  Surgeon: Yolonda Kida, MD;  Location: [facility];  Service: Cardiovascular;  Laterality: Left;  . LYMPH NODE BIOPSY Left 03/2014   cancerous  . PROSTATE SURGERY    . SKIN BIOPSY     Eyelid  . TONSILLECTOMY    . TOTAL KNEE ARTHROPLASTY Right 01/01/2018   Procedure: RIGHT TOTAL KNEE ARTHROPLASTY;  Surgeon: Renette Butters, MD;  Location: [facility];  Service: Orthopedics;  Laterality: Right;  . urethra and bladder unblocked  2012    SOCIAL HISTORY:   Social History   Tobacco Use  . Smoking status: Former Smoker    Last attempt to quit: 01/31/1996    Years since quitting: 21.9  . Smokeless tobacco: Never Used  Substance Use Topics  . Alcohol use: Yes    Alcohol/week: 14.0 standard drinks    Types: 14  Shots of liquor per week    FAMILY HISTORY:   Family History  Problem Relation Age of Onset  . Dementia Mother 64  . Hypertension Mother   . Hypertension Father   . Heart attack Father   . Heart disease Father   . Cancer Sister        breast  . Cancer Sister        breast  . Early death Paternal Grandfather   . Heart disease Paternal Grandfather     DRUG ALLERGIES:   Allergies  Allergen Reactions  . Adhesive [Tape] Rash    Bandaids after long term use Kinetic tape causes rash   Tape for IV is okay     REVIEW OF SYSTEMS:   Review of Systems  Constitutional: Positive for chills, fever and malaise/fatigue. Negative for weight loss.  HENT: Negative for ear discharge, ear pain, hearing loss, nosebleeds and tinnitus.   Eyes: Negative  for blurred vision, double vision and photophobia.  Respiratory: Negative for cough, hemoptysis, shortness of breath and wheezing.   Cardiovascular: Negative for chest pain, palpitations, orthopnea and leg swelling.  Gastrointestinal: Negative for abdominal pain, constipation, diarrhea, heartburn, melena, nausea and vomiting.  Genitourinary: Positive for frequency. Negative for dysuria, hematuria and urgency.       Hesitancy  Musculoskeletal: Negative for back pain, myalgias and neck pain.  Skin: Negative for rash.  Neurological: Negative for dizziness, tingling, tremors, sensory change, speech change, focal weakness and headaches.  Endo/Heme/Allergies: Does not bruise/bleed easily.  Psychiatric/Behavioral: Negative for depression.    MEDICATIONS AT HOME:   Prior to Admission medications   Medication Sig Start Date End Date Taking? Authorizing Provider  benazepril (LOTENSIN) 20 MG tablet TAKE ONE & ONE-HALF TABLETS BY MOUTH ONCE DAILY Patient taking differently: Take 30 mg by mouth daily. TAKE ONE & ONE-HALF TABLETS BY MOUTH ONCE DAILY 06/15/17  Yes Johnson, Megan P, DO  docusate sodium (COLACE) 100 MG capsule Take 1 capsule (100 mg total) by mouth 2 (two) times daily. To prevent constipation while taking pain medication. 01/01/18  Yes Prudencio Burly III, PA-C  gabapentin (NEURONTIN) 100 MG capsule Take 100 mg by mouth 3 (three) times daily.   Yes [provider]  hydrochlorothiazide (HYDRODIURIL) 25 MG tablet Take 1 tablet (25 mg total) by mouth daily. 06/15/17  Yes Johnson, Megan P, DO  Ibuprofen-Famotidine 800-26.6 MG TABS Take 1 tablet by mouth 3 (three) times daily as needed (arthritis).  09/22/16  Yes Johnson, Megan P, DO  methocarbamol (ROBAXIN-750) 750 MG tablet Take 1 tablet (750 mg total) by mouth every 6 (six) hours as needed for muscle spasms. 01/01/18  Yes Prudencio Burly III, PA-C  Multiple Vitamin (MULTIVITAMIN WITH MINERALS) TABS tablet Take 1 tablet by  mouth daily.   Yes [provider]  ondansetron (ZOFRAN) 4 MG tablet Take 1 tablet (4 mg total) by mouth every 8 (eight) hours as needed for nausea or vomiting. 01/01/18  Yes Martensen, Charna Elizabeth III, PA-C  oxyCODONE (ROXICODONE) 5 MG immediate release tablet Take 1 tablet (5 mg total) by mouth every 4 (four) hours as needed for breakthrough pain. 01/01/18 01/31/18 Yes Martensen, Charna Elizabeth III, PA-C  rivaroxaban (XARELTO) 10 MG TABS tablet Take 1 tablet (10 mg total) by mouth daily. For 30 days for DVT prophylaxis 01/01/18  Yes Prudencio Burly III, PA-C  rosuvastatin (CRESTOR) 20 MG tablet Take 1 tablet (20 mg total) by mouth daily. Patient taking differently: Take 10 mg by mouth every  other day.  06/15/17  Yes Johnson, Megan P, DO  Testosterone (ANDROGEL PUMP) 20.25 MG/ACT (1.62%) GEL Apply 2 Pump topically daily.  12/30/16  Yes [provider]  vardenafil (LEVITRA) 20 MG tablet Take 20 mg by mouth daily as needed for erectile dysfunction.  06/18/17  Yes [provider]  gabapentin (NEURONTIN) 300 MG capsule Take 1 capsule (300 mg total) by mouth 2 (two) times daily for 14 days. For 2 weeks post op for pain. Patient not taking: Reported on 01/13/2018 01/01/18 01/28/2018  Prudencio Burly III, PA-C  NON FORMULARY 1 application by Does not apply route at bedtime. CPAP    [provider]  Omega-3 Fatty Acids (EQL OMEGA 3 FISH OIL) 1200 MG CPDR Take 1,200 mg by mouth daily. Also includes 360mg  of Omega 3 09/22/16   Johnson, Megan P, DO      VITAL SIGNS:  Blood pressure (!) 115/45, pulse (!) 113, temperature (!) 103.1 F (39.5 C), temperature source Oral, resp. rate 19, weight 105 kg, SpO2 96 %.  PHYSICAL EXAMINATION:   Physical Exam  GENERAL:  61 y.o.-year-old patient lying in the bed with no acute distress.  EYES: Pupils equal, round, reactive to light and accommodation. No scleral icterus. Extraocular muscles intact.  HEENT: Head atraumatic,  normocephalic. Oropharynx and nasopharynx clear.  NECK:  Supple, no jugular venous distention. No thyroid enlargement, no tenderness.  LUNGS: Normal breath sounds bilaterally, no wheezing, rales,rhonchi or crepitation. No use of accessory muscles of respiration.  CARDIOVASCULAR: S1, S2 normal. No murmurs, rubs, or gallops.  ABDOMEN: Soft, nontender, nondistended. Bowel sounds present. No organomegaly or mass.  EXTREMITIES: No pedal edema, cyanosis, or clubbing.  NEUROLOGIC: Cranial nerves II through XII are intact. Muscle strength 5/5 in all extremities. Sensation intact. Gait not checked.  PSYCHIATRIC: The patient is alert and oriented x 3.  SKIN: No obvious rash, lesion, or ulcer.   LABORATORY PANEL:   CBC Recent Labs  Lab 01/13/2018 2021  WBC 12.8*  HGB 16.3  HCT 46.7  PLT 308   ------------------------------------------------------------------------------------------------------------------  Chemistries  Recent Labs  Lab 01/19/2018 2021  NA 131*  K 3.5  CL 97*  CO2 24  GLUCOSE 109*  BUN 24*  CREATININE 1.10  CALCIUM 9.6  AST 27  ALT 42  ALKPHOS 78  BILITOT 1.2   ------------------------------------------------------------------------------------------------------------------  Cardiac Enzymes No results for input(s): TROPONINI in the last 168 hours. ------------------------------------------------------------------------------------------------------------------  RADIOLOGY:  Dg Chest 2 View  Result Date: 01/23/2018 CLINICAL DATA:  Patient with fever and chills. EXAM: CHEST - 2 VIEW COMPARISON:  Chest CT 11/13/2017 FINDINGS: Monitoring leads overlie the patient. Normal cardiac and mediastinal contours. No consolidative pulmonary opacities. No pleural effusion or pneumothorax. Thoracic spine degenerative changes. IMPRESSION: No acute cardiopulmonary process. Electronically Signed   By: Lovey Newcomer M.D.   On: 01/20/2018 20:45    EKG:   Orders placed or performed  during the hospital encounter of 01/02/2018  . EKG 12-Lead  . EKG 12-Lead  . ED EKG 12-Lead  . ED EKG 12-Lead    IMPRESSION AND PLAN:   Jared Cowan  is a 62 y.o. male with a known history of osteoarthritis status post recent right knee total knee replacement surgery done 2 weeks ago, sleep apnea, hypertension, lymphoma on observation presents to hospital secondary to fevers and chills that started today.  1.  Sepsis-secondary to acute cystitis. -Follow-up urine and blood cultures -Started on Rocephin -Follow-up fever curve and WBC.  2.  Recent right total  knee replacement surgery-continue physical therapy.  Pain medications as needed. -On Xarelto for anticoagulation, DVT prophylaxis for 2 more weeks per discharge instructions  3.  Hypertension-continue home medications.  Hold hydrochlorothiazide  4.  Mild hyponatremia-secondary to dehydration.  IV fluids Hold hydrochlorothiazide  5.  DVT prophylaxis-on Xarelto.    All the records are reviewed and case discussed with ED provider. Management plans discussed with the patient, family and they are in agreement.  CODE STATUS: Full Code  TOTAL TIME TAKING CARE OF THIS PATIENT: 50 minutes.    Gladstone Lighter M.D on 01/12/2018 at 9:42 PM  Between 7am to 6pm - Pager - [phone]  After 6pm go to [email or web address] - password [email or web address] [facility]  Office  [phone]  CC: Primary care physician; Valerie Roys, DO

## 2018-01-17 NOTE — ED Triage Notes (Signed)
Patient c/o fever, chills, decreased urination. Patient reports right total knee replacement on 12/3.

## 2018-01-17 NOTE — ED Notes (Signed)
Report was called and given at this time to April, RN.

## 2018-01-17 NOTE — ED Provider Notes (Signed)
[facility] Emergency Department Provider Note   ____________________________________________   First MD Initiated Contact with Patient 01/29/2018 2038     (approximate)  I have reviewed the triage vital signs and the nursing notes.   HISTORY  Chief Complaint Fever    HPI Jared Cowan is a 62 y.o. male patient had knee replacement on the third of this month.  He is doing well with his knee.  He complains of urinary frequency and urgency going small amounts.  He has some dysuria.  He is also running a high fever and feeling sick.  This just started.   Past Medical History:  Diagnosis Date  . 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 Fracture    left arm  . 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 Hemorrhoids   . HOH (hard of hearing)   . Hyperlipidemia   . Hypertension   . Low testosterone   . Osteoarthritis    int he neck with pinched nerve   . Peripheral vascular disease ([place])   . Plantar fasciitis of left foot   . Precancerous skin lesion    Eye  . Sleep apnea    CPAP  . Tinnitus, bilateral   . Vitreous degeneration, left eye   . Wears dentures    full upper, partial lower    Patient Active Problem List   Diagnosis Date Noted  . Primary localized osteoarthritis of knee 01/01/2018  . Primary osteoarthritis of right knee 12/10/2017  . Erectile dysfunction 11/12/2017  . Cervical radiculopathy 10/15/2017  . Special screening for malignant neoplasms, colon   . Low testosterone 09/27/2015  . Allergic rhinitis 11/23/2014  . Essential hypertension 09/24/2014  . Hyperlipidemia 09/24/2014  . Hamstring strain 07/24/2014  . Frozen shoulder 07/24/2014  . Pulled hamstring 07/24/2014  . 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 APPENDECTOMY    . COLONOSCOPY WITH PROPOFOL N/A 12/13/2016   Procedure: COLONOSCOPY  WITH PROPOFOL;  Surgeon: Lin Landsman, MD;  Location: [place] [facility];  Service: Endoscopy;  Laterality: N/A;  sleep apnea  . [place]  . LASER OF PROSTATE W/ GREEN LIGHT PVP  2014  . LEFT HEART CATH AND CORONARY ANGIOGRAPHY Left 03/21/2016   Procedure: Left Heart Cath and Coronary Angiography;  Surgeon: Yolonda Kida, MD;  Location: [facility];  Service: Cardiovascular;  Laterality: Left;  . LYMPH NODE BIOPSY Left 03/2014   cancerous  . PROSTATE SURGERY    . SKIN BIOPSY     Eyelid  . TONSILLECTOMY    . TOTAL KNEE ARTHROPLASTY Right 01/01/2018   Procedure: RIGHT TOTAL KNEE ARTHROPLASTY;  Surgeon: Renette Butters, MD;  [facility];  Service: Orthopedics;  Laterality: Right;  . urethra and bladder unblocked  2012    Prior to Admission medications   Medication Sig Start Date End Date Taking? Authorizing Provider  benazepril (LOTENSIN) 20 MG tablet TAKE ONE & ONE-HALF TABLETS BY MOUTH ONCE DAILY Patient taking differently: Take 30 mg by mouth daily. TAKE ONE & ONE-HALF TABLETS BY MOUTH ONCE DAILY 06/15/17  Yes Johnson, Megan P, DO  docusate sodium (COLACE) 100 MG capsule Take 1 capsule (100 mg total) by mouth 2 (two) times daily. To prevent constipation while taking pain medication. 01/01/18  Yes Prudencio Burly III, PA-C  gabapentin (  NEURONTIN) 100 MG capsule Take 100 mg by mouth 3 (three) times daily.   Yes [provider]  hydrochlorothiazide (HYDRODIURIL) 25 MG tablet Take 1 tablet (25 mg total) by mouth daily. 06/15/17  Yes Johnson, Megan P, DO  Ibuprofen-Famotidine 800-26.6 MG TABS Take 1 tablet by mouth 3 (three) times daily as needed (arthritis).  09/22/16  Yes Johnson, Megan P, DO  methocarbamol (ROBAXIN-750) 750 MG tablet Take 1 tablet (750 mg total) by mouth every 6 (six) hours as needed for muscle spasms. 01/01/18  Yes Prudencio Burly III, PA-C  Multiple Vitamin (MULTIVITAMIN WITH MINERALS) TABS tablet Take 1 tablet by  mouth daily.   Yes [provider]  ondansetron (ZOFRAN) 4 MG tablet Take 1 tablet (4 mg total) by mouth every 8 (eight) hours as needed for nausea or vomiting. 01/01/18  Yes Martensen, Charna Elizabeth III, PA-C  oxyCODONE (ROXICODONE) 5 MG immediate release tablet Take 1 tablet (5 mg total) by mouth every 4 (four) hours as needed for breakthrough pain. 01/01/18 01/31/18 Yes Martensen, Charna Elizabeth III, PA-C  rivaroxaban (XARELTO) 10 MG TABS tablet Take 1 tablet (10 mg total) by mouth daily. For 30 days for DVT prophylaxis 01/01/18  Yes Prudencio Burly III, PA-C  rosuvastatin (CRESTOR) 20 MG tablet Take 1 tablet (20 mg total) by mouth daily. Patient taking differently: Take 10 mg by mouth every other day.  06/15/17  Yes Johnson, Megan P, DO  Testosterone (ANDROGEL PUMP) 20.25 MG/ACT (1.62%) GEL Apply 2 Pump topically daily.  12/30/16  Yes [provider]  vardenafil (LEVITRA) 20 MG tablet Take 20 mg by mouth daily as needed for erectile dysfunction.  06/18/17  Yes [provider]  gabapentin (NEURONTIN) 300 MG capsule Take 1 capsule (300 mg total) by mouth 2 (two) times daily for 14 days. For 2 weeks post op for pain. Patient not taking: Reported on 12/30/2017 01/01/18 01/07/2018  Prudencio Burly III, PA-C  NON FORMULARY 1 application by Does not apply route at bedtime. CPAP    [provider]  Omega-3 Fatty Acids (EQL OMEGA 3 FISH OIL) 1200 MG CPDR Take 1,200 mg by mouth daily. Also includes 360mg  of Omega 3 09/22/16   Park Liter P, DO    Allergies Adhesive [tape]  Family History  Problem Relation Age of Onset  . Dementia Mother 57  . Hypertension Mother   . Hypertension Father   . Heart attack Father   . Heart disease Father   . Cancer Sister        breast  . Cancer Sister        breast  . Early death Paternal Grandfather   . Heart disease Paternal Grandfather     Social History Social History   Tobacco Use  . Smoking status: Former  Smoker    Last attempt to quit: 01/31/1996    Years since quitting: 21.9  . Smokeless tobacco: Never Used  Substance Use Topics  . Alcohol use: Yes    Alcohol/week: 14.0 standard drinks    Types: 14 Shots of liquor per week  . Drug use: No    Review of Systems  Constitutional:  fever/chills Eyes: No visual changes. ENT: No sore throat. Cardiovascular: Denies chest pain. Respiratory: Denies shortness of breath. Gastrointestinal: No abdominal pain.  No nausea, no vomiting.  No diarrhea.  No constipation. Genitourinary: Negative for dysuria. Musculoskeletal: Negative for back pain. Skin: Negative for rash. Neurological: Negative for headaches, focal weakness    ____________________________________________  PHYSICAL EXAM:  VITAL SIGNS: ED Triage Vitals  Enc Vitals Group     BP 01/27/2018 2003 122/62     Pulse Rate 01/08/2018 2003 (!) 136     Resp 01/29/2018 2003 20     Temp 01/14/2018 2003 (!) 102.7 F (39.3 C)     Temp Source 01/09/2018 2003 Oral     SpO2 01/08/2018 2003 96 %     Weight 01/27/2018 2005 231 lb 7.7 oz (105 kg)     Height --      Head Circumference --      Peak Flow --      Pain Score 01/08/2018 2005 1     Pain Loc --      Pain Edu? --      Excl. in [place]? --    Constitutional: Alert and oriented.  Looks uncomfortable and sick Eyes: Conjunctivae are normal. Head: Atraumatic. Nose: No congestion/rhinnorhea. Mouth/Throat: Mucous membranes are moist.  Oropharynx non-erythematous. Neck: No stridor.  Cardiovascular: Normal rate, regular rhythm. Grossly normal heart sounds.  Good peripheral circulation. Respiratory: Normal respiratory effort.  No retractions. Lungs CTAB. Gastrointestinal: Soft and nontender. No distention. No abdominal bruits. No CVA tenderness. Rectal: Prostate is not tender.  Palpation of his prostate makes the tip of his penis hurt though.  Prostate is not particularly soft. Musculoskeletal: No lower extremity tenderness nor edema.  No joint  effusions. Neurologic:  Normal speech and language. No gross focal neurologic deficits are appreciated. Skin:  Skin is warm, dry and intact. No rash noted. Psychiatric: Mood and affect are normal. Speech and behavior are normal.  ____________________________________________   LABS (all labs ordered are listed, but only abnormal results are displayed)  Labs Reviewed  COMPREHENSIVE METABOLIC PANEL - Abnormal; Notable for the following components:      Result Value   Sodium 131 (*)    Chloride 97 (*)    Glucose, Bld 109 (*)    BUN 24 (*)    All other components within normal limits  CBC WITH DIFFERENTIAL/PLATELET - Abnormal; Notable for the following components:   WBC 12.8 (*)    Neutro Abs 11.1 (*)    Abs Immature Granulocytes 0.09 (*)    All other components within normal limits  PROTIME-INR - Abnormal; Notable for the following components:   Prothrombin Time 15.5 (*)    All other components within normal limits  URINALYSIS, COMPLETE (UACMP) WITH MICROSCOPIC - Abnormal; Notable for the following components:   Color, Urine YELLOW (*)    APPearance HAZY (*)    Hgb urine dipstick MODERATE (*)    Protein, ur 30 (*)    Leukocytes, UA SMALL (*)    WBC, UA >50 (*)    Bacteria, UA MANY (*)    All other components within normal limits  CULTURE, BLOOD (ROUTINE X 2)  CULTURE, BLOOD (ROUTINE X 2)  INFLUENZA PANEL BY PCR (TYPE A & B)  URINALYSIS, ROUTINE W REFLEX MICROSCOPIC  I-STAT CG4 LACTIC ACID, ED  CG4 I-STAT (LACTIC ACID)   ____________________________________________  EKG   ____________________________________________  RADIOLOGY  ED MD interpretation: Chest x-ray read by radiology reviewed by me shows no acute disease Official radiology report(s): Dg Chest 2 View  Result Date: 01/08/2018 CLINICAL DATA:  Patient with fever and chills. EXAM: CHEST - 2 VIEW COMPARISON:  Chest CT 11/13/2017 FINDINGS: Monitoring leads overlie the patient. Normal cardiac and mediastinal  contours. No consolidative pulmonary opacities. No pleural effusion or pneumothorax. Thoracic spine degenerative changes. IMPRESSION: No acute  cardiopulmonary process. Electronically Signed   By: Lovey Newcomer M.D.   On: 01/13/2018 20:45    ____________________________________________   PROCEDURES  Procedure(s) performed:   Procedures  Critical Care performed: Critical care time 20 minutes this includes evaluating the patient discussing the prognosis with the patient and his wife and during physical.  Also discussing the patient with the hospitalist.  ____________________________________________   INITIAL IMPRESSION / [place] / ED COURSE  Patient is has sepsis due to UTI.  Not finding any CVA tenderness.  There is no prostate tenderness either.  Patient's knee where he had the knee replacement is not red or inflamed chest x-ray is clear the only source is the UTI.        ____________________________________________   FINAL CLINICAL IMPRESSION(S) / ED DIAGNOSES  Final diagnoses:  Fever in other diseases  Urinary tract infection with hematuria, site unspecified  Sepsis, due to unspecified organism, unspecified whether acute organ dysfunction present [facility])     ED Discharge Orders    None       Note:  This document was prepared using Dragon voice recognition software and may include unintentional dictation errors.    Nena Polio, MD 12/31/2017 2126

## 2018-01-20 LAB — URINE CULTURE: Culture: >=100000 — AB

## 2018-01-21 ENCOUNTER — Ambulatory Visit: Admit: 2018-01-21 | Payer: 59 | Admitting: Otolaryngology

## 2018-01-21 ENCOUNTER — Ambulatory Visit: Payer: 59 | Admitting: Dietician

## 2018-01-21 SURGERY — SEPTOPLASTY, NOSE, WITH NASAL TURBINATE REDUCTION
Anesthesia: Choice | Laterality: Bilateral

## 2018-01-22 LAB — CULTURE, BLOOD (ROUTINE X 2)
Culture: NO GROWTH
Culture: NO GROWTH
Special Requests: ADEQUATE
Special Requests: ADEQUATE

## 2018-01-30 NOTE — Progress Notes (Signed)
   01-29-18 0700  Clinical Encounter Type  Visited With Patient not available;Family  Visit Type Death  Referral From Nurse  Spiritual Encounters  Spiritual Needs Grief support;Emotional  [facility] [facility] offered emotional, spiritual and grief support to family after patient death

## 2018-01-30 NOTE — Progress Notes (Signed)
Pt refused cpap tonight, stated he would try it tomorrow if he was still admitted.

## 2018-01-30 NOTE — Progress Notes (Addendum)
Called to code blue some time shortly before ~0530AM (Fri February 04, 2018). Pt admitted 12/19 PM for sepsis, UTI. Recent R total knee replacement (01/01/2018), on Xarelto. Hx OSA, declined CPAP at bedtime. Reportedly found unresponsive, asystole. CPR, intubation, Epi, Bicarb, Calcium. (-) ROSC. ToD 26AM. Family arrived shortly thereafter, and were informed of pt passing. Possible apnea + brady/arrest during sleep. MI less likely, minimal CAD on 03/2016 cath. PE less likely, on Xarelto. No other obvious etiology.  -P. Lanora Reveron (Nocturnist/Hospitalist)   ----------   Family declined autopsy.  -P. Issis Lindseth (Nocturnist/Hospitalist)

## 2018-01-30 NOTE — ED Provider Notes (Signed)
[facility] Department of Emergency Medicine   Code Blue CONSULT NOTE  Chief Complaint: Cardiac arrest/unresponsive   Level V Caveat: Unresponsive  History of present illness: I was contacted by the hospital for a CODE BLUE cardiac arrest upstairs and presented to the patient's bedside.  When I arrived the patient had CPR ongoing and had yet to be put on monitor or have any medications given.  Apparently she was admitted to the hospital earlier today for urinary tract infection and sepsis.  No further past medical history available when I arrived at bedside.  ROS: Unable to obtain, Level V caveat  Scheduled Meds: . benazepril  30 mg Oral Daily  . docusate sodium  100 mg Oral BID  . gabapentin  100 mg Oral TID  . multivitamin with minerals  1 tablet Oral Daily  . omega-3 acid ethyl esters  1,000 mg Oral Daily  . rivaroxaban  10 mg Oral Q supper  . rosuvastatin  10 mg Oral QODAY  . Testosterone  2 Pump Topical Daily   Continuous Infusions: . sodium chloride 100 mL/hr at 2018-01-19 0325  . cefTRIAXone (ROCEPHIN)  IV     PRN Meds:.acetaminophen **OR** acetaminophen, methocarbamol, ondansetron **OR** ondansetron (ZOFRAN) IV Past Medical History:  Diagnosis Date  . 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 Fracture    left arm  . 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 Hemorrhoids   . HOH (hard of hearing)   . Hyperlipidemia   . Hypertension   . Low testosterone   . Osteoarthritis    int he neck with pinched nerve   . Peripheral vascular disease ([place])   . Plantar fasciitis of left foot   . Precancerous skin lesion    Eye  . Sleep apnea    CPAP  . Tinnitus, bilateral   . Vitreous degeneration, left eye   . Wears dentures    full upper, partial lower   Past Surgical History:  Procedure Laterality Date  . APPENDECTOMY    . COLONOSCOPY WITH PROPOFOL N/A 12/13/2016   Procedure: COLONOSCOPY WITH PROPOFOL;  Surgeon: Lin Landsman,  MD;  Location: [facility];  Service: Endoscopy;  Laterality: N/A;  sleep apnea  . [place]  . LASER OF PROSTATE W/ GREEN LIGHT PVP  2014  . LEFT HEART CATH AND CORONARY ANGIOGRAPHY Left 03/21/2016   Procedure: Left Heart Cath and Coronary Angiography;  Surgeon: Yolonda Kida, MD;  Location: [facility];  Service: Cardiovascular;  Laterality: Left;  . LYMPH NODE BIOPSY Left 03/2014   cancerous  . PROSTATE SURGERY    . SKIN BIOPSY     Eyelid  . TONSILLECTOMY    . TOTAL KNEE ARTHROPLASTY Right 01/01/2018   Procedure: RIGHT TOTAL KNEE ARTHROPLASTY;  Surgeon: Renette Butters, MD;  Location: [facility];  Service: Orthopedics;  Laterality: Right;  . urethra and bladder unblocked  2012   Social History   Socioeconomic History  . Marital status: Married    Spouse name: Not on file  . Number of children: Not on file  . Years of education: Not on file  . Highest education level: Not on file  Occupational History  . Not on file  Social Needs  . Financial resource strain: Not on file  . Food insecurity:    Worry: Not on file    Inability: Not on file  . Transportation needs:  Medical: Not on file    Non-medical: Not on file  Tobacco Use  . Smoking status: Former Smoker    Last attempt to quit: 01/31/1996    Years since quitting: 21.9  . Smokeless tobacco: Never Used  Substance and Sexual Activity  . Alcohol use: Yes    Alcohol/week: 14.0 standard drinks    Types: 14 Shots of liquor per week  . Drug use: No  . Sexual activity: Not Currently  Lifestyle  . Physical activity:    Days per week: Not on file    Minutes per session: Not on file  . Stress: Not on file  Relationships  . Social connections:    Talks on phone: Not on file    Gets together: Not on file    Attends religious service: Not on file    Active member of club or organization: Not on file    Attends meetings of clubs or organizations: Not on file    Relationship status: Not on  file  . Intimate partner violence:    Fear of current or ex partner: Not on file    Emotionally abused: Not on file    Physically abused: Not on file    Forced sexual activity: Not on file  Other Topics Concern  . Not on file  Social History Narrative   Lives at home with wife, now using a walker after his right knee TKR   Allergies  Allergen Reactions  . Adhesive [Tape] Rash    Bandaids after long term use Kinetic tape causes rash   Tape for IV is okay     Last set of Vital Signs (not current) Vitals:   01/02/2018 2230 01/10/2018 2346  BP: (!) 102/51 (!) 141/59  Pulse: (!) 117 (!) 107  Resp: 18 (!) 24  Temp:  100.1 F (37.8 C)  SpO2: 94% 98%      Physical Exam  Gen: unresponsive Cardiovascular: pulseless  Resp: apneic. Breath sounds equal bilaterally with bagging  Abd: nondistended  Neuro: GCS 3, unresponsive to pain  HEENT: No blood in posterior pharynx, gag reflex absent  Neck: No crepitus  Musculoskeletal: No deformity  Skin: warm  Procedures  INTUBATION Performed by: Darel Hong Required items: required blood products, implants, devices, and special equipment available Patient identity confirmed: provided demographic data and hospital-assigned identification number Time out: Immediately prior to procedure a "time out" was called to verify the correct patient, procedure, equipment, support staff and site/side marked as required. Indications: Cardiac arrest Intubation method: MAC 4 Preoxygenation: BVM Sedatives: None required Paralytic: None required Tube Size: 7.5 cuffed Post-procedure assessment: chest rise and ETCO2 monitor Breath sounds: equal and absent over the epigastrium Tube secured by Respiratory Therapy Patient tolerated the procedure well with no immediate complications.  CRITICAL CARE Performed by: Darel Hong Total critical care time: 30 Critical care time was exclusive of separately billable procedures and treating other  patients. Critical care was necessary to treat or prevent imminent or life-threatening deterioration. Critical care was time spent personally by me on the following activities: development of treatment plan with patient and/or surrogate as well as nursing, discussions with consultants, evaluation of patient's response to treatment, examination of patient, obtaining history from patient or surrogate, ordering and performing treatments and interventions, ordering and review of laboratory studies, ordering and review of radiographic studies, pulse oximetry and re-evaluation of patient's condition.  Cardiopulmonary Resuscitation (CPR) Procedure Note  Directed/Performed by: Darel Hong I personally directed ancillary staff and/or performed CPR in  an effort to regain return of spontaneous circulation and to maintain cardiac, neuro and systemic perfusion.    Medical Decision making  I arrived in the patient's room to find CPR ongoing.  He had IV access we gave 1 mg of epinephrine and I assisted respiratory therapy and bagging the patient.  He was placed on pacer pads and was found to be in asystole.  I intubated the patient with chest compressions ongoing and continued resuscitation.  Given his unclear history was given calcium and bicarbonate in case he has some degree of hyperkalemia.  Throughout the course of the resuscitation he was given an additional 2 doses of epinephrine and for roughly 15 minutes he remained in asystole.  He never had a shockable rhythm but he never had a perfusing rhythm.  I asked the room if they had any objection to calling the code after 15 minutes and none were raised.  The patient's time of death was 5:34 AM.  The hospitalist was present during the code and he will follow-up with the patient's family.     Darel Hong, MD 2018-01-28 [phone]

## 2018-01-30 NOTE — Discharge Summary (Signed)
   [place] at [facility]    Death Summary:    [name] [id number] is a 63 y.o. male, Outpatient Primary MD for the patient is Valerie Roys, DO   Mr. Jared Cowan, is a 63 year old male with history of arthritis status post knee replacement surgery on the right side about 2 weeks prior to this admission, sleep apnea on CPAP, hypertension, history of lymphoma currently on observation admitted to hospital secondary to sepsis on 01/02/2018.   Patient had a fever of 104 F at home, had an elevated white cell count.  Was a little encephalopathic on admission and wife was at bedside Urine looked infected while cultures were pending.  He was started on Rocephin and admitted to the floor as his vitals were stable. -Patient was more alert later that night, refused to sleep Pap and went to bed.  Was noted to be unresponsive and a CODE BLUE was called for cardiac arrest at 5:20 AM on 01-Feb-2018. -Unfortunately patient did not survive the arrest.  He was pronounced dead at 5:34 AM  Pronounced dead by Dr. Mable Paris  onJanuary 03, 2020   @5 :27 AM                   Cause of death :  1.  Cardiac arrest-could have been acute coronary syndrome 2.  Sepsis secondary to Klebsiella UTI 3.  Sleep apnea 4.  Osteoarthritis with recent right knee surgery 5.  Peripheral vascular disease 6.  Hypertension 7.  Small lymphatic lymphoma     Gladstone Lighter M.D on 01/25/2018 at 8:39 AM  [place] at [place] [phone]  Total clinical and documentation time for today Under 30 minutes   Last Note

## 2018-01-30 NOTE — Progress Notes (Signed)
Pt fund unresponsive by lab tech. CPR initiated. Code blue called. Family notified. Pt expired at 0534.

## 2018-01-30 DEATH — deceased

## 2018-11-18 ENCOUNTER — Other Ambulatory Visit: Payer: 59

## 2018-11-18 ENCOUNTER — Ambulatory Visit: Payer: 59

## 2018-11-22 ENCOUNTER — Ambulatory Visit: Payer: 59 | Admitting: Oncology

## 2019-01-03 ENCOUNTER — Ambulatory Visit: Payer: 59 | Admitting: Oncology
